# Patient Record
Sex: Male | Born: 1970
Health system: Southern US, Community
[De-identification: ages and names within clinical notes are randomized; demographics above are authoritative.]

## PROBLEM LIST (undated history)

## (undated) DIAGNOSIS — K5792 Diverticulitis of intestine, part unspecified, without perforation or abscess without bleeding: Secondary | ICD-10-CM

## (undated) DIAGNOSIS — K219 Gastro-esophageal reflux disease without esophagitis: Secondary | ICD-10-CM

## (undated) DIAGNOSIS — J302 Other seasonal allergic rhinitis: Secondary | ICD-10-CM

## (undated) DIAGNOSIS — I10 Essential (primary) hypertension: Secondary | ICD-10-CM

## (undated) HISTORY — PX: CHOLECYSTECTOMY: SHX55

## (undated) HISTORY — DX: Essential (primary) hypertension: I10

## (undated) HISTORY — DX: Diverticulitis of intestine, part unspecified, without perforation or abscess without bleeding: K57.92

## (undated) HISTORY — DX: Gastro-esophageal reflux disease without esophagitis: K21.9

## (undated) HISTORY — PX: KNEE SURGERY: SHX244

## (undated) HISTORY — PX: COLONOSCOPY: SHX174

## (undated) HISTORY — PX: UMBILICAL HERNIA REPAIR: SHX196

---

## 2001-09-03 ENCOUNTER — Ambulatory Visit (HOSPITAL_COMMUNITY): Admission: RE | Admit: 2001-09-03 | Discharge: 2001-09-03 | Payer: Self-pay | Admitting: Internal Medicine

## 2001-09-03 ENCOUNTER — Encounter: Payer: Self-pay | Admitting: Internal Medicine

## 2003-12-19 ENCOUNTER — Ambulatory Visit (HOSPITAL_COMMUNITY): Admission: RE | Admit: 2003-12-19 | Discharge: 2003-12-19 | Payer: Self-pay | Admitting: Internal Medicine

## 2004-05-25 ENCOUNTER — Encounter (HOSPITAL_COMMUNITY): Admission: RE | Admit: 2004-05-25 | Discharge: 2004-06-04 | Payer: Self-pay | Admitting: Internal Medicine

## 2004-05-25 ENCOUNTER — Inpatient Hospital Stay (HOSPITAL_COMMUNITY): Admission: RE | Admit: 2004-05-25 | Discharge: 2004-05-27 | Payer: Self-pay | Admitting: Internal Medicine

## 2004-05-25 ENCOUNTER — Ambulatory Visit (HOSPITAL_COMMUNITY): Admission: RE | Admit: 2004-05-25 | Discharge: 2004-05-25 | Payer: Self-pay | Admitting: Internal Medicine

## 2004-06-11 ENCOUNTER — Ambulatory Visit (HOSPITAL_COMMUNITY): Admission: RE | Admit: 2004-06-11 | Discharge: 2004-06-11 | Payer: Self-pay | Admitting: General Surgery

## 2004-09-15 ENCOUNTER — Ambulatory Visit (HOSPITAL_COMMUNITY): Admission: RE | Admit: 2004-09-15 | Discharge: 2004-09-15 | Payer: Self-pay | Admitting: Internal Medicine

## 2004-10-12 ENCOUNTER — Emergency Department (HOSPITAL_COMMUNITY): Admission: EM | Admit: 2004-10-12 | Discharge: 2004-10-12 | Payer: Self-pay | Admitting: Emergency Medicine

## 2009-03-16 ENCOUNTER — Ambulatory Visit (HOSPITAL_COMMUNITY): Admission: RE | Admit: 2009-03-16 | Discharge: 2009-03-16 | Payer: Self-pay | Admitting: Internal Medicine

## 2009-03-27 ENCOUNTER — Encounter (INDEPENDENT_AMBULATORY_CARE_PROVIDER_SITE_OTHER): Payer: Self-pay | Admitting: *Deleted

## 2009-04-05 HISTORY — PX: CHOLECYSTECTOMY: SHX55

## 2009-05-06 HISTORY — PX: ESOPHAGOGASTRODUODENOSCOPY: SHX1529

## 2009-05-08 ENCOUNTER — Ambulatory Visit: Payer: Self-pay | Admitting: Internal Medicine

## 2009-05-08 DIAGNOSIS — Z8639 Personal history of other endocrine, nutritional and metabolic disease: Secondary | ICD-10-CM

## 2009-05-08 DIAGNOSIS — K219 Gastro-esophageal reflux disease without esophagitis: Secondary | ICD-10-CM | POA: Insufficient documentation

## 2009-05-08 DIAGNOSIS — Z862 Personal history of diseases of the blood and blood-forming organs and certain disorders involving the immune mechanism: Secondary | ICD-10-CM | POA: Insufficient documentation

## 2009-05-14 ENCOUNTER — Encounter: Payer: Self-pay | Admitting: Internal Medicine

## 2009-05-15 ENCOUNTER — Encounter: Payer: Self-pay | Admitting: Internal Medicine

## 2009-05-18 ENCOUNTER — Ambulatory Visit (HOSPITAL_COMMUNITY): Admission: RE | Admit: 2009-05-18 | Discharge: 2009-05-18 | Payer: Self-pay | Admitting: Internal Medicine

## 2009-05-18 ENCOUNTER — Ambulatory Visit: Payer: Self-pay | Admitting: Internal Medicine

## 2009-05-18 ENCOUNTER — Encounter: Payer: Self-pay | Admitting: Internal Medicine

## 2009-05-20 ENCOUNTER — Encounter: Payer: Self-pay | Admitting: Internal Medicine

## 2010-04-16 ENCOUNTER — Ambulatory Visit (HOSPITAL_COMMUNITY): Admission: RE | Admit: 2010-04-16 | Discharge: 2010-04-16 | Payer: Self-pay | Admitting: Internal Medicine

## 2010-04-21 ENCOUNTER — Encounter (HOSPITAL_COMMUNITY): Admission: RE | Admit: 2010-04-21 | Discharge: 2010-05-21 | Payer: Self-pay | Admitting: Internal Medicine

## 2010-05-03 ENCOUNTER — Encounter (INDEPENDENT_AMBULATORY_CARE_PROVIDER_SITE_OTHER): Payer: Self-pay | Admitting: General Surgery

## 2010-05-03 ENCOUNTER — Ambulatory Visit (HOSPITAL_COMMUNITY)
Admission: RE | Admit: 2010-05-03 | Discharge: 2010-05-04 | Payer: Self-pay | Source: Home / Self Care | Admitting: General Surgery

## 2010-09-26 ENCOUNTER — Encounter: Payer: Self-pay | Admitting: Internal Medicine

## 2010-09-27 ENCOUNTER — Emergency Department (HOSPITAL_COMMUNITY)
Admission: EM | Admit: 2010-09-27 | Discharge: 2010-09-27 | Payer: Self-pay | Source: Home / Self Care | Admitting: Emergency Medicine

## 2010-09-28 ENCOUNTER — Ambulatory Visit (HOSPITAL_COMMUNITY)
Admission: RE | Admit: 2010-09-28 | Discharge: 2010-09-28 | Payer: Self-pay | Source: Home / Self Care | Attending: Family Medicine | Admitting: Family Medicine

## 2010-09-28 LAB — COMPREHENSIVE METABOLIC PANEL
ALT: 25 U/L (ref 0–53)
Alkaline Phosphatase: 51 U/L (ref 39–117)
BUN: 16 mg/dL (ref 6–23)
GFR calc Af Amer: >60 mL/min (ref >60)
GFR calc non Af Amer: >60 mL/min (ref >60)
Sodium: 135 mEq/L (ref 135–145)
Total Bilirubin: 1.5 mg/dL — ABNORMAL HIGH (ref 0.3–1.2)

## 2010-09-28 LAB — CBC
HCT: 44.1 % (ref 39.0–52.0)
MCH: 30.2 pg (ref 26.0–34.0)
MCHC: 35.8 g/dL (ref 30.0–36.0)
RDW: 12.9 % (ref 11.5–15.5)
WBC: 10 10*3/uL (ref 4.0–10.5)

## 2010-09-28 LAB — URINALYSIS, ROUTINE W REFLEX MICROSCOPIC
Ketones, ur: NEGATIVE mg/dL
Specific Gravity, Urine: >1.03 — ABNORMAL HIGH (ref 1.005–1.030)
Urine Glucose, Fasting: NEGATIVE mg/dL
Urobilinogen, UA: 0.2 mg/dL (ref 0.0–1.0)
pH: 5.5 (ref 5.0–8.0)

## 2010-09-28 LAB — DIFFERENTIAL
Basophils Absolute: 0.1 10*3/uL (ref 0.0–0.1)
Basophils Relative: 1 % (ref 0–1)
Neutrophils Relative %: 63 % (ref 43–77)

## 2010-09-28 LAB — LIPASE, BLOOD: Lipase: 34 U/L (ref 11–59)

## 2010-10-12 ENCOUNTER — Encounter: Payer: Self-pay | Admitting: Gastroenterology

## 2010-10-12 ENCOUNTER — Ambulatory Visit (INDEPENDENT_AMBULATORY_CARE_PROVIDER_SITE_OTHER): Payer: PRIVATE HEALTH INSURANCE | Admitting: Gastroenterology

## 2010-10-12 DIAGNOSIS — R109 Unspecified abdominal pain: Secondary | ICD-10-CM

## 2010-10-12 DIAGNOSIS — R1011 Right upper quadrant pain: Secondary | ICD-10-CM | POA: Insufficient documentation

## 2010-10-13 ENCOUNTER — Encounter: Payer: Self-pay | Admitting: Gastroenterology

## 2010-10-13 ENCOUNTER — Encounter (INDEPENDENT_AMBULATORY_CARE_PROVIDER_SITE_OTHER): Payer: PRIVATE HEALTH INSURANCE | Admitting: Internal Medicine

## 2010-10-13 ENCOUNTER — Encounter: Payer: Self-pay | Admitting: Internal Medicine

## 2010-10-13 DIAGNOSIS — R109 Unspecified abdominal pain: Secondary | ICD-10-CM | POA: Insufficient documentation

## 2010-10-19 ENCOUNTER — Encounter: Payer: Self-pay | Admitting: Gastroenterology

## 2010-10-21 NOTE — Assessment & Plan Note (Signed)
Summary: dropped off stool sample  pt returned ifobt and it was negative  Allergies: 1)  ! Penicillin   Other Orders: Immuno-chemical Fecal Occult ([id number])   Orders Added: 1)  Immuno-chemical Fecal Occult [[id number]]

## 2010-10-27 NOTE — Assessment & Plan Note (Signed)
Summary: ABD PAIN   Vital Signs:  Patient profile:   40 year old male Height:      78 inches Weight:      294 pounds Temp:     97.9 degrees F Pulse rate:   88 / minute BP sitting:   138 / 98  Visit Type:  Initial Consult Referring Provider:  Landry Mellow, PA Primary Care Provider:  Sherwood Gambler   History of Present Illness: Jose [name] is a pleasant 40 year old Caucasian male who presents at the request of Landry Mellow, [place]. He reports onset of sudden, sharp RUQ pain lasting a few minutes approximately 4 weeks ago. Following this a few days later, experienced RLQ pain that again was sudden and lasted a few minutes. He reports BM normal, every 2-3 days, no evidence of melena or hematochezia. He went to ED on 1/24, but no scans were completed. He saw PCP, who then ordered a CT scan as none was done in ED. This showed no acute findings, no evidence of appendicitis, no inflammatory changes of small or large bowel. He had a cholecystectomy in Sept 2010. He is concerned about his LFTs. Noted in Aug 2011 prior to cholecystectomy, Tbili and indirect bilirubin elevated, Dr. Malvin Johns felt related to Gilbert's.  He has had no further episodes of abdominal pain since last Thursday.   Most recent labs Jan 2012: CBC normal, no anemia, lipase 34, Tbili 1.5  otherwise nl.   Allergies: 1)  ! Penicillin  Past History:  Past Medical History: GERD Hypertension TCS, Dr. Karilyn Cota, few polyps, due for repeat 10 years. (unsure year) EGD/ED Sept 2010: Schatzki's ring, s/p dilatation, +eosinophilic esophagitis  Past Surgical History: Umbilical hernia repair Cholecystectomy Aug 2011  Family History: Reviewed history from 05/08/2009 and no changes required. no family history of liver disease, colon cancer, chronic GI illnesses.  Social History: Reviewed history from 05/08/2009 and no changes required. Married, no children. Works with [employer]. Patient has never smoked.  Alcohol Use - no Illicit Drug Use -  no  Review of Systems General:  Denies fever, chills, and anorexia. Eyes:  Denies blurring, irritation, and discharge. ENT:  Denies sore throat, hoarseness, and difficulty swallowing. CV:  Denies chest pains and syncope. Resp:  Denies dyspnea at rest and wheezing. GI:  Complains of abdominal pain; denies difficulty swallowing, pain on swallowing, nausea, indigestion/heartburn, diarrhea, constipation, change in bowel habits, bloody BM's, and black BMs. GU:  Denies urinary burning and urinary frequency. MS:  Denies joint pain / LOM, joint swelling, and joint stiffness. Derm:  Denies rash, itching, and dry skin. Neuro:  Denies weakness and syncope. Psych:  Denies depression and anxiety. Endo:  Denies cold intolerance and heat intolerance. Heme:  Denies bruising and bleeding.  Physical Exam  General:  Well developed, well nourished, no acute distress. Head:  Normocephalic and atraumatic. Eyes:  sclera without icterus Neck:  Supple; no masses or thyromegaly. Lungs:  Clear throughout to auscultation. Heart:  Regular rate and rhythm; no murmurs, rubs,  or bruits. Abdomen:  +BS, obese, soft, non-distended, RUQ mildly TTP, LLQ mildly TTP.  Msk:  Symmetrical with no gross deformities. Normal posture. Pulses:  Normal pulses noted. Extremities:  No clubbing, cyanosis, edema or deformities noted. Neurologic:  Alert and  oriented x4;  grossly normal neurologically. Skin:  Intact without significant lesions or rashes. Psych:  Alert and cooperative. Normal mood and affect.   Impression & Recommendations:  Problem # 1:  ABDOMINAL PAIN-MULTIPLE SITES (ICD-53.22)  40 year old male s/p cholecystectomy  in Aug 2011 with onset of RUQ pain, few minutes lasting approximately 4 weeks ago, followed by RLQ pain a few days later. No melena or hematochezia, no change in bowel habits. No N/V. No further episodes of pain since last Thursday. Slight elevation of Tbili and Indirect bilirubin, consistent with  Gilbert's. elevated bilirubins likely r/t Gilbert's, yet can't exclude presence of possibly hepatobiliary etiology. Prior colonoscopy years ago, unsure date. will obtain ifobt for presence of occult blood.   Hepatic function profile ifobt Consider colonoscopy if +ifobt Further rec's after labs reviewed  Orders: Consultation Level III (04540)  Problem # 2:  LIVER FUNCTION TESTS, ABNORMAL, HX OF (ICD-V12.2)  see # 1.   Orders: Consultation Level III [phone])  Other Orders: T-Hepatic Function [id number])   Orders Added: 1)  T-Hepatic Function [[id number]] 2)  Consultation Level III [30865]  Appended Document: ABD PAIN received colonoscopy from Dr. Gabriel Cirri, April 2007: normal.

## 2010-10-27 NOTE — Letter (Signed)
Summary: [facility] REPORT FROM [facility]  [facility] REPORT FROM [facility]   Imported By: Rexene Alberts 10/19/2010 09:26:29  _____________________________________________________________________  External Attachment:    Type:   Image     Comment:   External Document

## 2010-11-08 LAB — CONVERTED CEMR LAB
ALT: 19 units/L (ref 0–53)
Albumin: 4.7 g/dL (ref 3.5–5.2)
Alkaline Phosphatase: 57 units/L (ref 39–117)
Bilirubin, Direct: 0.2 mg/dL (ref 0.0–0.3)
Total Bilirubin: 1.4 mg/dL — ABNORMAL HIGH (ref 0.3–1.2)
Total Protein: 6.4 g/dL (ref 6.0–8.3)

## 2010-11-18 LAB — SURGICAL PCR SCREEN
MRSA, PCR: NEGATIVE
Staphylococcus aureus: NEGATIVE

## 2010-11-18 LAB — HEPATIC FUNCTION PANEL
AST: 37 U/L (ref 0–37)
Albumin: 3.6 g/dL (ref 3.5–5.2)

## 2010-11-18 LAB — BASIC METABOLIC PANEL
CO2: 28 mEq/L (ref 19–32)
Chloride: 108 mEq/L (ref 96–112)
GFR calc Af Amer: >60 mL/min (ref >60)
Potassium: 3.5 mEq/L (ref 3.5–5.1)

## 2010-11-18 LAB — DIFFERENTIAL
Basophils Absolute: 0 10*3/uL (ref 0.0–0.1)
Lymphocytes Relative: 31 % (ref 12–46)
Lymphs Abs: 2.4 10*3/uL (ref 0.7–4.0)
Monocytes Relative: 6 % (ref 3–12)
Neutro Abs: 4.6 10*3/uL (ref 1.7–7.7)

## 2010-11-18 LAB — CBC
HCT: 37 % — ABNORMAL LOW (ref 39.0–52.0)
MCHC: 34.8 g/dL (ref 30.0–36.0)
RDW: 13.7 % (ref 11.5–15.5)
WBC: 7.9 10*3/uL (ref 4.0–10.5)

## 2011-01-21 NOTE — Consult Note (Signed)
Jose [name], Jose [name]              ACCOUNT NO.:  [id number]   MEDICAL RECORD NO.:  [id number]          PATIENT TYPE:  INP   LOCATION:  A204                          FACILITY:  [facility]   PHYSICIAN:  Dalia Heading, M.D.  DATE OF BIRTH:  11-23-1970   DATE OF CONSULTATION:  05/26/2004  DATE OF DISCHARGE:                                   CONSULTATION   REASON FOR CONSULTATION:  Umbilical cellulitis.   HISTORY OF PRESENT ILLNESS:  The patient is a 40 year old white male who  presented to the hospital with recurrent drainage from his umbilicus.  He  states he has had previous episodes of drainage from his umbilicus, but this  was more severe than before.  The drainage has since somewhat eased.  His  pain around his umbilicus somewhat has eased.   This all started approximately four days ago.   PAST MEDICAL HISTORY:  Unremarkable.   PAST SURGICAL HISTORY:  Unremarkable.   CURRENT MEDICATIONS:  1.  Cleocin.  2.  Levaquin.   ALLERGIES:  No known drug allergies.   REVIEW OF SYSTEMS:  Noncontributory.   PHYSICAL EXAMINATION:  GENERAL:  The patient is a well-developed, well-  nourished white male, in no acute distress.  HEENT:  He is afebrile.  Vital signs are stable.  ABDOMEN:  Soft, nontender, nondistended.  A small umbilical hernia is noted  at the base. No purulent drainage was noted. Minimal erythema is noted  around the umbilicus.   LABORATORY DATA:  White blood cell count is within normal limits.  MET-7 is  within normal limits.   CT scan shows a small umbilical hernia with either fat or a small abscess,  though no intraabdominal pathology is noted.   IMPRESSION:  Umbilical hernia/ cellulitis.  There appears to be no  incarcerated bowel present.   PLAN:  I would continue treating the patient with intravenous antibiotic or  with oral antibiotics.  Once this has cleared, an umbilical herniorrhaphy  would be indicated.  This was explained to the patient who agrees to the  treatment plan.  This was discussed with Dr. Sherwood Gambler.  He is to follow up in  my office on June 01, 2004.      MAJ/MEDQ  D:  05/26/2004  T:  05/26/2004  Job:  [id number]   cc:   Madelin Rear. Sherwood Gambler, M.D.  [address]  [place]  [place] [address]  Fax: [phone]

## 2011-01-21 NOTE — Op Note (Signed)
Jose [name], Jose [name]              ACCOUNT NO.:  [id number]   MEDICAL RECORD NO.:  [id number]          PATIENT TYPE:  AMB   LOCATION:  DAY                           FACILITY:  [facility]   PHYSICIAN:  Dalia Heading, M.D.  DATE OF BIRTH:  1971-02-21   DATE OF PROCEDURE:  06/11/2004  DATE OF DISCHARGE:                                 OPERATIVE REPORT   PREOPERATIVE DIAGNOSIS:  Umbilical hernia.   POSTOPERATIVE DIAGNOSIS:  Umbilical hernia.   OPERATION/PROCEDURE:  Umbilical herniorrhaphy.   SURGEON:  Dalia Heading, M.D.   ANESTHESIA:  General endotracheal anesthesia.   INDICATIONS:  The patient is a 40 year old white male who presents with  umbilical hernia.  The risks and benefits of the procedure including  bleeding, infection, recurrence of the hernia were fully explained to the  patient who gave informed consent.   DESCRIPTION OF PROCEDURE:  The patient was placed in the supine position.  After induction of general endotracheal anesthesia, the abdomen was prepped  and draped using the usual sterile technique with Betadine.  Surgical site  confirmation was performed.   An infraumbilical incision was made down to the fascia.  The umbilicus was  freed away from the underlying umbilical hernia and fascia.  The hernia sac  was excised without difficulty.  The fascial edges were reapproximated using  0 Ethibond interrupted sutures.  The base of the umbilicus was secured back  to the fascia using a 2-0 Vicryl interrupted suture.  The subcutaneous layer  was reapproximated using a 3-0 Vicryl interrupted suture.  The skin was  closed using __________.  Sensorcaine 0.5% was instilled into the  surrounding wound.   All tape and needle counts were correct at the end of the procedure.  The  patient was extubated in the operating room and went back to the recovery  room awake in stable condition.   COMPLICATIONS:  None.   SPECIMENS:  None.   ESTIMATED BLOOD LOSS:  MinimalLoraine Leriche   MAJ/MEDQ  D:  06/11/2004  T:  06/11/2004  Job:  [id number]   cc:   Madelin Rear. Sherwood Gambler, M.D.  [address]  [place]  [place] [address]  Fax: [phone]

## 2011-01-21 NOTE — Discharge Summary (Signed)
Jose [name], Jose [name]              ACCOUNT NO.:  [id number]   MEDICAL RECORD NO.:  [id number]          PATIENT TYPE:  INP   LOCATION:  A204                          FACILITY:  [facility]   PHYSICIAN:  Madelin Rear. Sherwood Gambler, MD  DATE OF BIRTH:  [birth year]   DATE OF ADMISSION:  05/25/2004  DATE OF DISCHARGE:  09/22/2005LH                                 DISCHARGE SUMMARY   DISCHARGE DIAGNOSES:  1.  Cellulitis of abdomen.  2.  Umbilical hernia.   DISCHARGE MEDICATIONS:  Cleocin 300 mg p.o. q.i.d.   SUMMARY:  The patient was admitted with abdominal pain and drainage of foul  smelling, purulent material from the umbilical area.  He was on outpatient  antibiotic therapy, including IV daily doses of Rocephin, but progressed and  became constitutionally affected with myalgias and malaise, fever and  chills.  CT scan was obtained and revealed no large collections, but there  was apparently evidence of a urachal cyst infection.  He was seen in  consultation by surgery for a noted umbilical hernia and omphalitis.  He  resolved well on IV antibiotics and was scheduled for elective umbilical  hernia repair per Dr. Lovell Sheehan.     Lawr   LJF/MEDQ  D:  06/25/2004  T:  06/25/2004  Job:  [id number]

## 2011-01-21 NOTE — H&P (Signed)
NAMEHENOK, Jose [name]              ACCOUNT NO.:  [id number]   MEDICAL RECORD NO.:  [id number]          PATIENT TYPE:  INP   LOCATION:  A204                          FACILITY:  [facility]   PHYSICIAN:  Madelin Rear. Fusco, M.D.DATE OF BIRTH:  Aug 09, 1971   DATE OF ADMISSION:  05/25/2004  DATE OF DISCHARGE:  LH                                HISTORY & PHYSICAL   CHIEF COMPLAINT:  Abdominal pain.   HISTORY OF PRESENT ILLNESS:  The patient has been seen two or three days  prior to admission for mid periumbilical abdominal pain followed by an  abrupt rupture of foul-smelling purulent material.  He was noted at that  time to have a periumbilical cellulitis on physical examination.  He was  started on outpatient parenteral antibiotics with Rocephin 1 g IV daily  given, and after several days he has clinically deteriorated having  increasing malaise, nausea, and polymyalgia.  Due to his failure on  parenteral antibiotics, he was admitted for the addition of antibiotics and  close broader spectrum coverage.  The patient has had no complaints of  hematemesis, hematochezia, or melena.  No cough or sputum, and no arthritic  complaints.   PAST MEDICAL HISTORY:  Noncontributory.   SOCIAL HISTORY:  Noncontributory.   FAMILY HISTORY:  Noncontributory.   REVIEW OF SYSTEMS:  As under HPI, all else is negative.   PHYSICAL EXAMINATION:  GENERAL:  He is awake, alert, cooperative, in no  acute distress.  HEENT:  No JVD or adenopathy.  NECK:  Supple.  CARDIAC:  Regular rhythm with no murmurs, rubs, or gallops.  ABDOMEN:  Periumbilical erythema is noted with increased temperature noted  as well.  There was no frank subcutaneous emphysema noted on physical  examination.  The remainder of his abdominal examination was negative.  EXTREMITIES:  Without cyanosis, clubbing, or edema.  NEUROLOGIC:  Nonfocal.   LABORATORY DATA:  CT scan was obtained to rule out a ureteral cyst/abscess  or intra-abdominal process,  and this was positive for a possible umbilical  hernia containing fat only, as well as a small pocket of subcutaneous  emphysema noted.  No large collections of fluid or frank abscess formation  was noted.  CBC was unremarkable.  Electrolytes unrevealing.  Urinalysis  negative.   IMPRESSION:  Periumbilical cellulitis with soft tissue gas, probably  secondary to abscess rupture.  He is started on broad spectrum coverage,  specifically anaerobes are of concern, so Cleocin was added to Levaquin.  He  will be seen in consultation by surgery to see if anything needs to be done  in terms of exploration/debridement/repair of umbilical hernia in the  future.      LJF/MEDQ  D:  05/26/2004  T:  05/26/2004  Job:  [id number]

## 2011-01-21 NOTE — H&P (Signed)
Jose [name], Jose [name]              ACCOUNT NO.:  [id number]   MEDICAL RECORD NO.:  [id number]           PATIENT TYPE:   LOCATION:                                 FACILITY:   PHYSICIAN:  Dalia Heading, M.D.       DATE OF BIRTH:   DATE OF ADMISSION:  06/11/2004  DATE OF DISCHARGE:  LH                                HISTORY & PHYSICAL   CHIEF COMPLAINT:  Umbilical hernia.   HISTORY OF PRESENT ILLNESS:  The patient is a 40 year old white male who  presented with cellulitis of his umbilicus to [facility].  This was  treated with antibiotics, and he now presents for an umbilical  herniorrhaphy.   PAST MEDICAL HISTORY:  Unremarkable.   PAST SURGICAL HISTORY:  Unremarkable.   CURRENT MEDICATIONS:  None.   ALLERGIES:  No known drug allergies.   REVIEW OF SYSTEMS:  Noncontributory.   PHYSICAL EXAMINATION:  GENERAL:  The patient is an obese white male in no  acute distress.  VITAL SIGNS:  He is afebrile, and vital signs are stable.  CHEST:  Lungs clear to auscultation with equal breath sounds bilaterally.  CARDIAC:  Regular rate and rhythm without S3, S4, or murmurs.  ABDOMEN:  Soft with a small umbilical hernia present at the base.  No  purulent drainage is noted.   IMPRESSION:  Umbilical hernia.   PLAN:  The patient is scheduled for an umbilical herniorrhaphy on June 11, 2004.  The risks and benefits of the procedure, including bleeding,  infection, and recurrence of the hernia, were fully explained to the  patient, who gave informed consent.       ___________________________________________  Dalia Heading, M.D.    MAJ/MEDQ  D:  06/08/2004  T:  06/08/2004  Job:  [id number]   cc:   [facility]   Madelin Rear. Sherwood Gambler, M.D.  [address]  [place]  [place] [address]  Fax: [phone]

## 2011-12-10 IMAGING — CT CT ABD-PELV W/ CM
2 of 4 series · 17 of 46 positions shown, 19 images · IV contrast (agent unspecified)
Comparison: 10/12/2004

CLINICAL DATA: The right lower quadrant pain

CT ABDOMEN AND PELVIS WITH CONTRAST
TECHNIQUE: Multidetector CT imaging of the abdomen and pelvis was
performed following the standard protocol without intravenous
contrast.
Contrast:  100 ml Dmnipaque-MNN

[Series 2: abd_pel_with 5.0 b40f · axial · 0.94mm/px · z∈[-534,-8]mm · 14 of 115 slices shown, 16 images]
[im 5/115  soft-tissue]
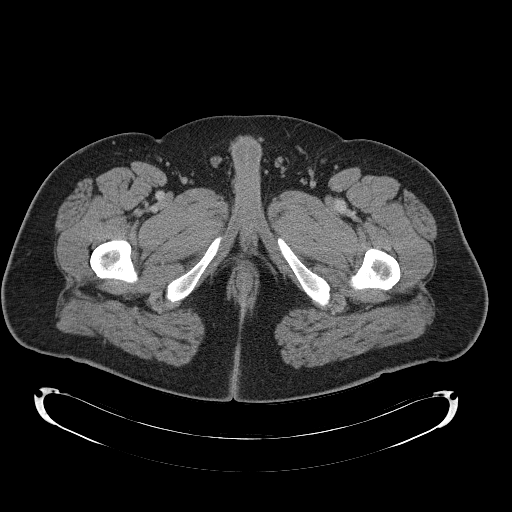
[im 5/115  bone]
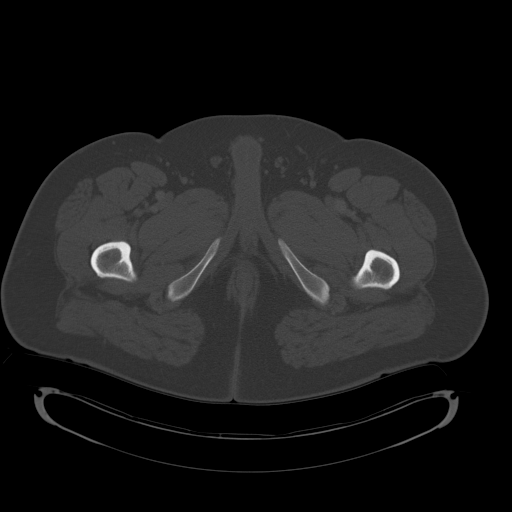
[im 15/115  soft-tissue]
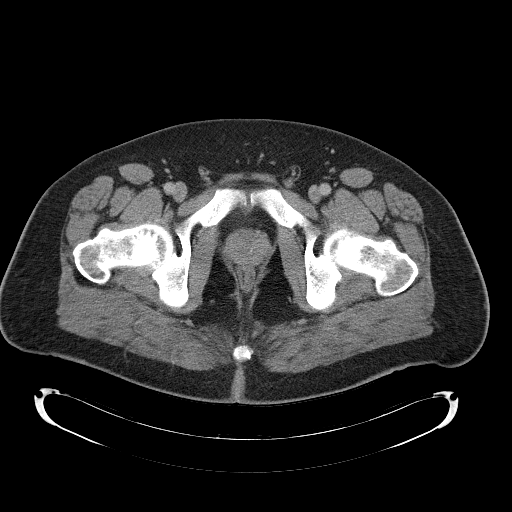
[im 20/115  soft-tissue]
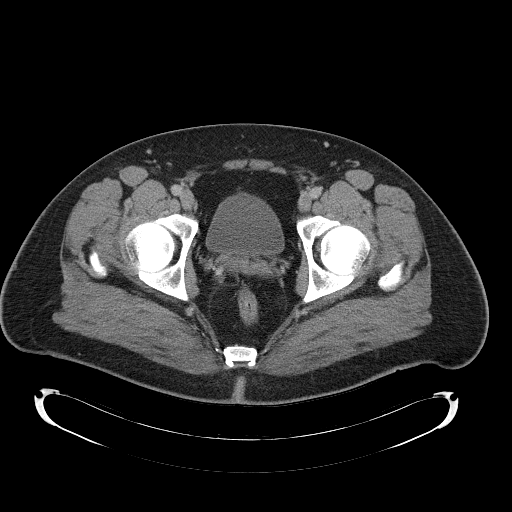
[im 30/115  soft-tissue]
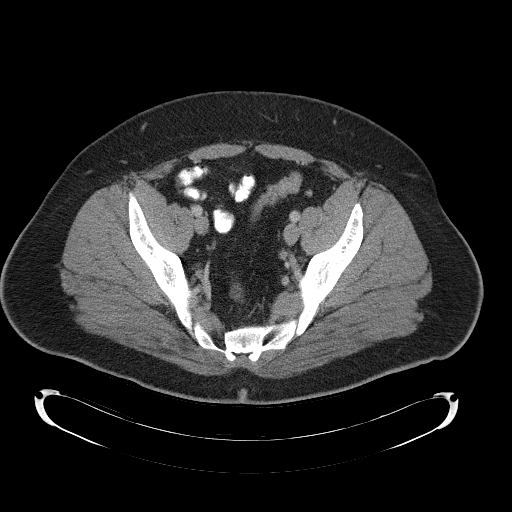
[im 40/115  soft-tissue]
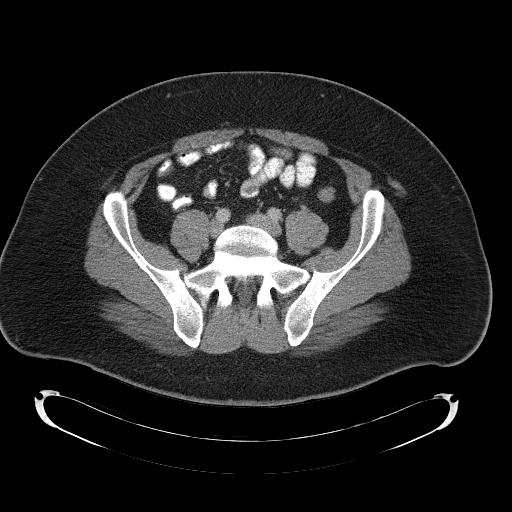
[im 45/115  soft-tissue]
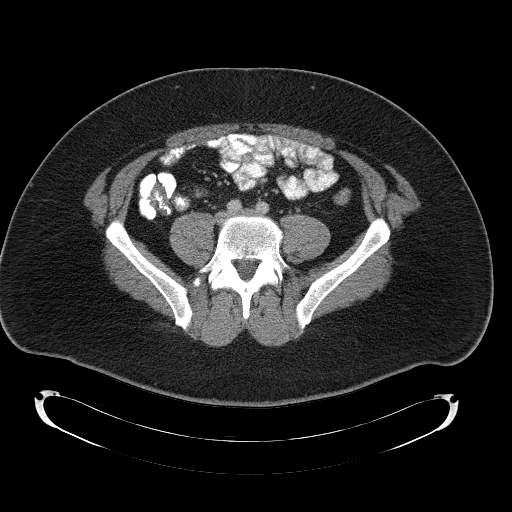
[im 55/115  soft-tissue]
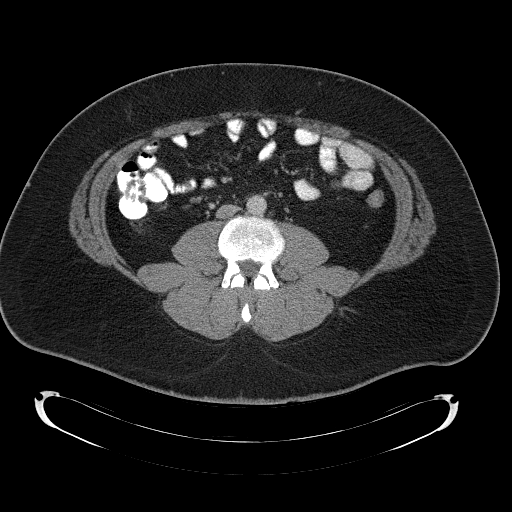
[im 60/115  soft-tissue]
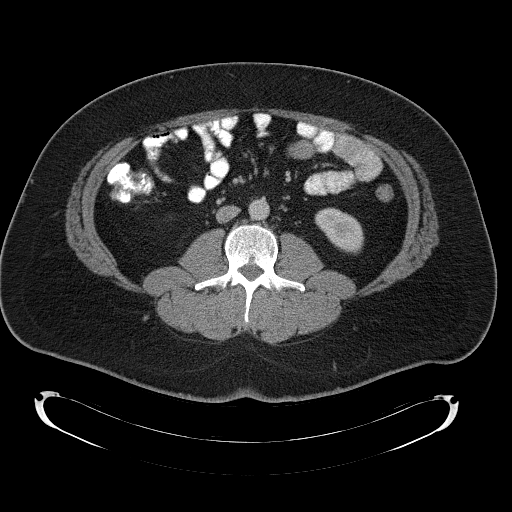
[im 70/115  soft-tissue]
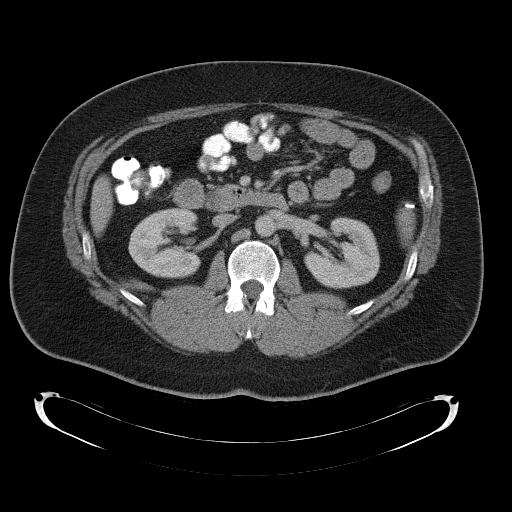
[im 70/115  bone]
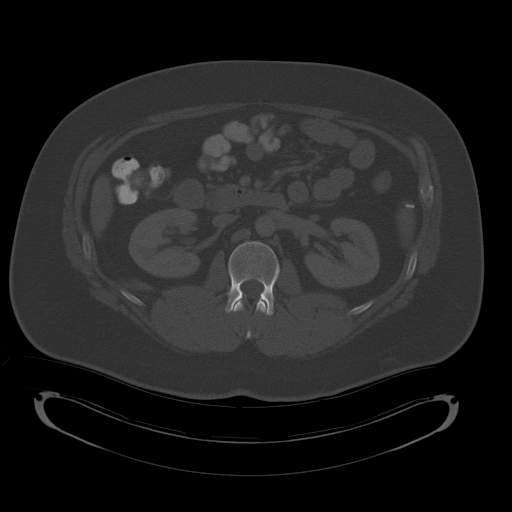
[im 75/115  soft-tissue]
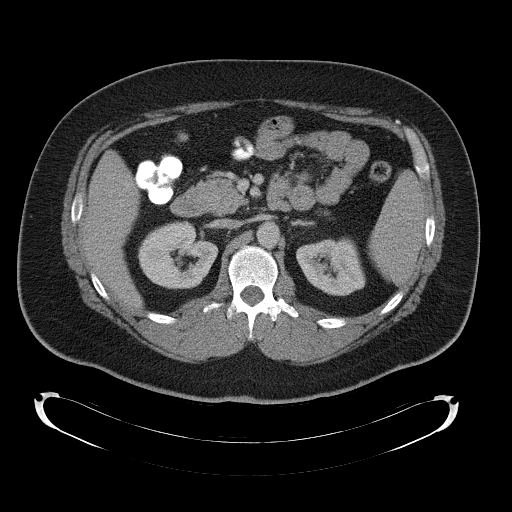
[im 85/115  soft-tissue]
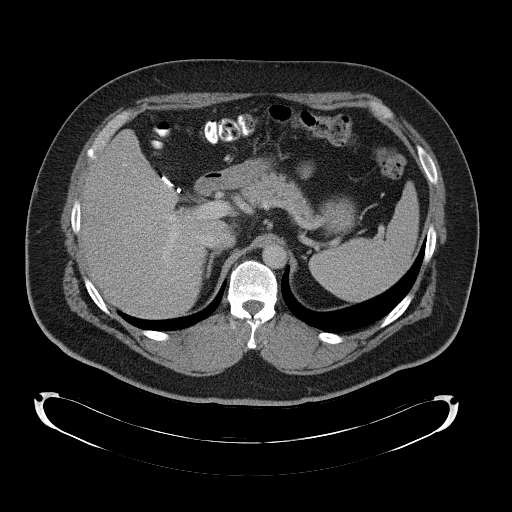
[im 95/115  soft-tissue]
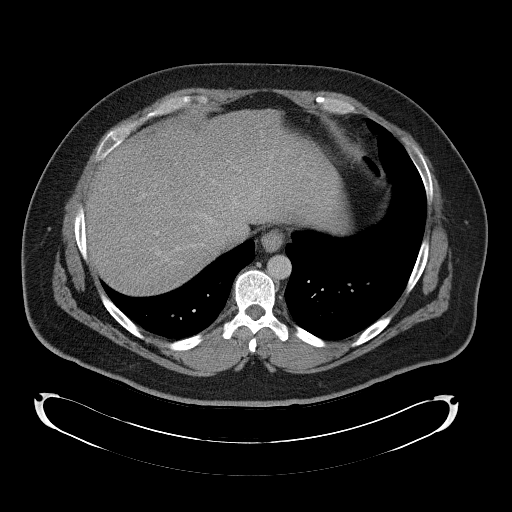
[im 100/115  soft-tissue]
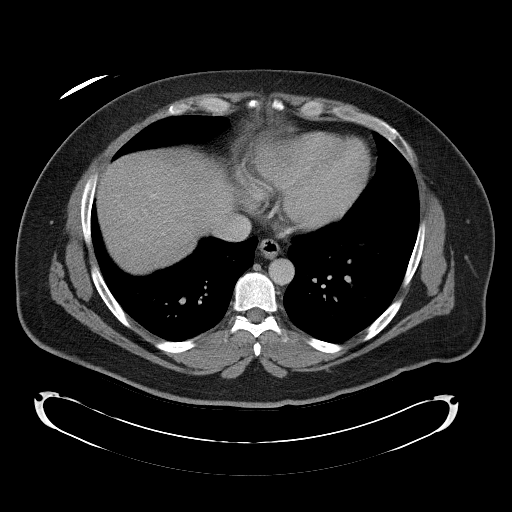
[im 110/115  soft-tissue]
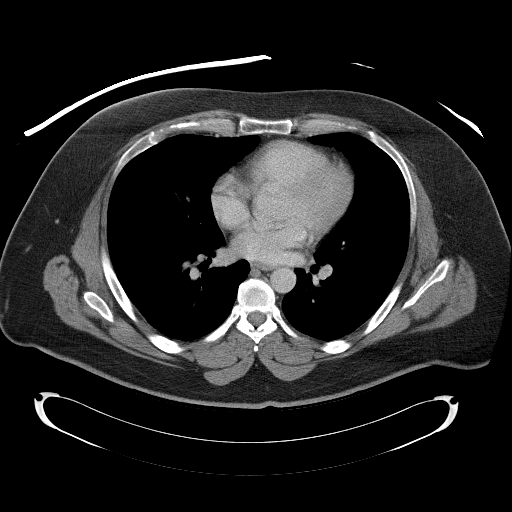

[Series 4: abd_pel_with 3.0 spo cor · coronal · 0.90mm/px · 3 of 99 slices shown]
[im 33/99  soft-tissue]
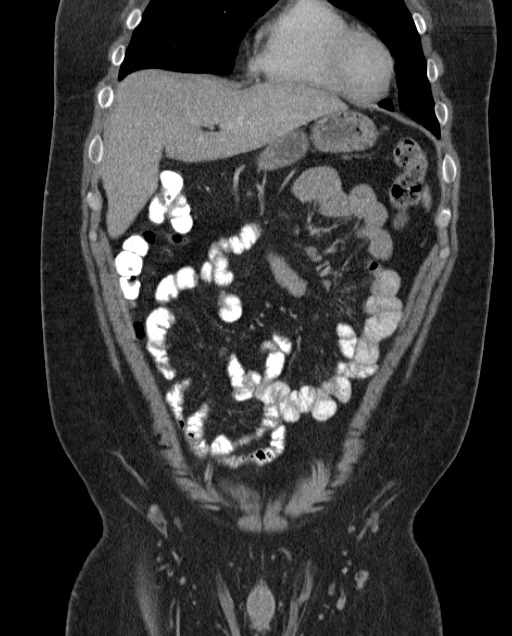
[im 44/99  soft-tissue]
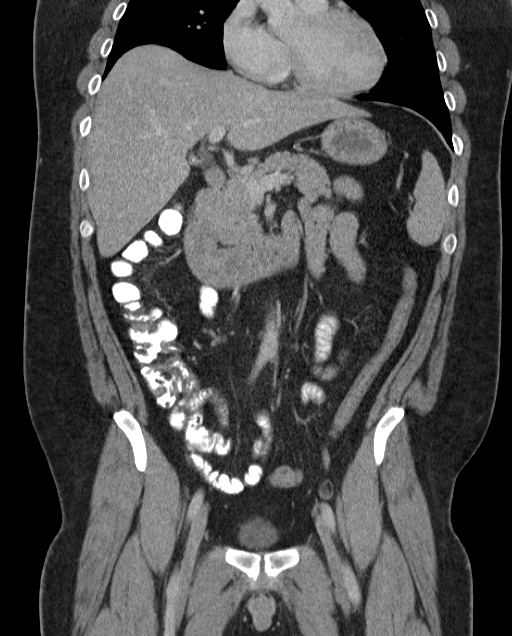
[im 55/99  soft-tissue]
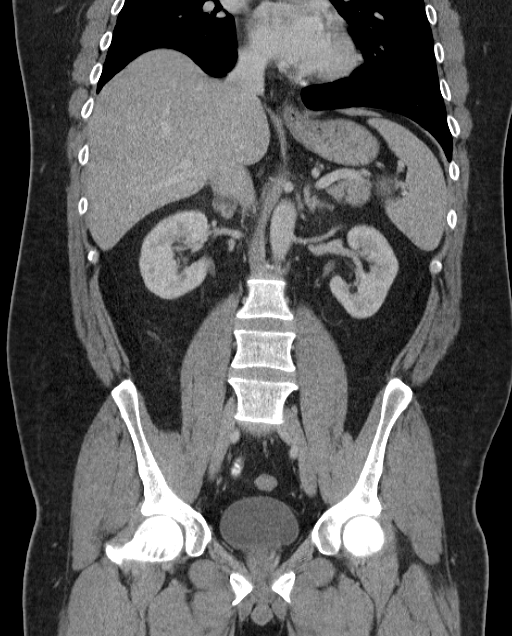

[17 of 46 positions shown; findings below may reference images not displayed]

FINDINGS: The lung bases are clear. Liver, spleen, pancreas, and
adrenals normal.  No urinary tract calculi or obstruction.  No
adenopathy or ascites.  No mass or fluid collections.  Prior
cholecystectomy.

There are no inflammatory changes of the large or small bowel.  The
appendix is small with no evidence for inflammation.  Osseous
structures intact.
IMPRESSION: 1.  No acute or significant findings.
2.  Specifically  no CT evidence for appendicitis or other findings
in the right lower quadrant that would explain the patient's
symptoms.
3.  Prior cholecystectomy.

## 2012-02-09 ENCOUNTER — Encounter: Payer: Self-pay | Admitting: Orthopedic Surgery

## 2012-02-09 ENCOUNTER — Ambulatory Visit (INDEPENDENT_AMBULATORY_CARE_PROVIDER_SITE_OTHER): Payer: PRIVATE HEALTH INSURANCE | Admitting: Orthopedic Surgery

## 2012-02-09 ENCOUNTER — Ambulatory Visit (INDEPENDENT_AMBULATORY_CARE_PROVIDER_SITE_OTHER): Payer: PRIVATE HEALTH INSURANCE

## 2012-02-09 VITALS — BP 110/80 | Ht 78.0 in | Wt 306.0 lb

## 2012-02-09 DIAGNOSIS — M25569 Pain in unspecified knee: Secondary | ICD-10-CM | POA: Insufficient documentation

## 2012-02-09 DIAGNOSIS — M25462 Effusion, left knee: Secondary | ICD-10-CM

## 2012-02-09 DIAGNOSIS — M2350 Chronic instability of knee, unspecified knee: Secondary | ICD-10-CM

## 2012-02-09 DIAGNOSIS — M179 Osteoarthritis of knee, unspecified: Secondary | ICD-10-CM

## 2012-02-09 DIAGNOSIS — M1710 Unilateral primary osteoarthritis, unspecified knee: Secondary | ICD-10-CM | POA: Insufficient documentation

## 2012-02-09 DIAGNOSIS — M23329 Other meniscus derangements, posterior horn of medial meniscus, unspecified knee: Secondary | ICD-10-CM

## 2012-02-09 DIAGNOSIS — M25469 Effusion, unspecified knee: Secondary | ICD-10-CM

## 2012-02-09 MED ORDER — DICLOFENAC POTASSIUM 50 MG PO TABS: 50.0000 mg | ORAL_TABLET | Freq: Two times a day (BID) | ORAL | Status: DC

## 2012-02-09 NOTE — Patient Instructions (Addendum)
You have received a steroid shot. 15% of patients experience increased pain at the injection site with in the next 24 hours. This is best treated with ice and tylenol extra strength 2 tabs every 8 hours. If you are still having pain please call the office.   Apply ice at the end of each day   Wear sleeve   start new medication

## 2012-02-09 NOTE — Progress Notes (Signed)
Patient ID: Jose [name], male   DOB: April 24, 1971, 41 y.o.   MRN: [id number] Chief Complaint  Patient presents with  . Knee Pain    left knee pain, old injury and surgery 1997, fell 02/06/12 and hurting again    Left knee pain since Monday of this week. Patient has had knee arthroscopic surgery 1997 was told at that time he'll need second procedures later on he is basically had on and off symptoms for several years now. However on Monday he twisted his knee again and since that time he said throbbing 5/10 intermittent pain with swelling and a feeling of weakness in the knee. It already been wearing a knee sleeve. He does not complaining of catching or locking.  He has a review of systems positive for fatigue heartburn joint pain and seasonal allergies although the systems were negative  Past Medical History  Diagnosis Date  . GERD (gastroesophageal reflux disease)   . Hypertension     Past Surgical History  Procedure Date  . Colonoscopy     by Dr. Lane Hacker polyps  . Esophagogastroduodenoscopy 05/2009    Schatzki's ring, s/p dilatation + eosinophilic  . Cholecystectomy 04/2009  . Umbilical hernia repair   . Knee surgery     left  . Cholecystectomy     family history includes Cancer in an unspecified family member and Heart disease in an unspecified family member.  BP 110/80  Ht 6\' 6"  (1.981 m)  Wt 306 lb (138.801 kg)  BMI 35.36 kg/m2   General appearance is normal  The patient is alert and oriented x3  The patient's mood and affect are normal  Gait assessment: He has a limp favoring his left leg The cardiovascular exam reveals normal pulses and temperature without edema swelling.  The lymphatic system is negative for palpable lymph nodes  The sensory exam is normal.  There are no pathologic reflexes.  Balance is normal.   Exam of the left knee swelling moderate joint effusion, he also has medial joint line tenderness and lateral joint line tenderness laxity of the  cruciate ligament, normal strength skin is intact McMurray sign is equivocal  Right knee full range of motion normal strength and stability normal skin no tenderness no swelling  Upper extremity exam  Inspection and palpation revealed no abnormalities in the upper extremities.  Range of motion is full without contracture.  Motor exam is normal with grade 5 strength.  The joints are fully reduced without subluxation.  There is no atrophy or tremor and muscle tone is normal.  All joints are stable.  X-rays show a significant amount of arthrosis for his age  Impression arthritis, meniscal pathology  Recommend aspiration injection anti-inflammatories and a reevaluation in 2 weeks  Knee  Injection and aspiration Procedure Note  Pre-operative Diagnosis: left knee oa, effusion  Post-operative Diagnosis: same  Indications: pain, swelling  Anesthesia: ethyl chloride   Procedure Details   Verbal consent was obtained for the procedure. Time out was completed.The joint was prepped with alcohol, followed by  Ethyl chloride spray and The 18-gauge needle was inserted into the joint via lateral approach and we aspirated approximately 10 cc of clear yellow fluid  This was followed by the injection of 4ml 1% lidocaine and 1 ml of depomedrol  was then injected into the joint . The needle was removed and the area cleansed and dressed.  Complications:  None; patient tolerated the procedure well.

## 2012-02-22 ENCOUNTER — Ambulatory Visit (INDEPENDENT_AMBULATORY_CARE_PROVIDER_SITE_OTHER): Payer: PRIVATE HEALTH INSURANCE | Admitting: Orthopedic Surgery

## 2012-02-22 VITALS — BP 152/90 | Ht 78.0 in | Wt 306.0 lb

## 2012-02-22 DIAGNOSIS — M23329 Other meniscus derangements, posterior horn of medial meniscus, unspecified knee: Secondary | ICD-10-CM

## 2012-02-22 NOTE — Patient Instructions (Addendum)
Arthroscopic Procedure, Knee An arthroscopic procedure can find what is wrong with your knee. PROCEDURE Arthroscopy is a surgical technique that allows your orthopedic surgeon to diagnose and treat your knee injury with accuracy. They will look into your knee through a small instrument. This is almost like a small (pencil sized) telescope. Because arthroscopy affects your knee less than open knee surgery, you can anticipate a more rapid recovery. Taking an active role by following your caregiver's instructions will help with rapid and complete recovery. Use crutches, rest, elevation, ice, and knee exercises as instructed. The length of recovery depends on various factors including type of injury, age, physical condition, medical conditions, and your rehabilitation. Your knee is the joint between the large bones (femur and tibia) in your leg. Cartilage covers these bone ends which are smooth and slippery and allow your knee to bend and move smoothly. Two menisci, thick, semi-lunar shaped pads of cartilage which form a rim inside the joint, help absorb shock and stabilize your knee. Ligaments bind the bones together and support your knee joint. Muscles move the joint, help support your knee, and take stress off the joint itself. Because of this all programs and physical therapy to rehabilitate an injured or repaired knee require rebuilding and strengthening your muscles. AFTER THE PROCEDURE  After the procedure, you will be moved to a recovery area until most of the effects of the medication have worn off. Your caregiver will discuss the test results with you.   Only take over-the-counter or prescription medicines for pain, discomfort, or fever as directed by your caregiver.  SEEK MEDICAL CARE IF:    You have increased bleeding from your wounds.   You see redness, swelling, or have increasing pain in your wounds.   You have pus coming from your wound.   You have an oral temperature above 102 F  (38.9 C).   You notice a bad smell coming from the wound or dressing.   You have severe pain with any motion of your knee.  SEEK IMMEDIATE MEDICAL CARE IF:    You develop a rash.   You have difficulty breathing.   You have any allergic problems.  Document Released: 08/19/2000 Document Revised: 08/11/2011 Document Reviewed: 03/12/2008 [facility] Patient Information 2012 [place], [place].Arthroscopic Procedure, Knee Care After Refer to this sheet in the next few weeks. These discharge instructions provide you with general information on caring for yourself after you leave the hospital. Your caregiver may also give you specific instructions. Your treatment has been planned according to the most current medical practices available, but unavoidable complications sometimes occur. If you have any problems or questions after discharge, please call your caregiver. HOME CARE INSTRUCTIONS   It will be normal to be sore for a couple days after surgery. See your caregiver if this seems to be getting worse rather than better. Only take over-the-counter or prescription medicines for pain, discomfort, or fever as directed by your caregiver.  Take showers rather than baths, or as directed by your caregiver.   Change bandages (dressings) if necessary or as directed.   You may resume normal diet and activities as directed or allowed.   Avoid lifting or driving until you are directed otherwise.   Make an appointment to see your caregiver for stitches (suture) or staple removal as directed.   You may put ice on the area.   Put ice in a plastic bag.   Place a towel between your skin and the bag.   Leave the ice  on for 15 to 20 minutes, 3 to 4 times per day for the first 2 days.  SEEK MEDICAL CARE IF:    You have increased bleeding from your wounds.   You see redness, swelling, or have increasing pain in your wounds.   You have pus coming from your wound.   You have an oral temperature above 102  F (38.9 C).   You notice a bad smell coming from the wound or dressing.   You have severe pain with any motion of your knee.  SEEK IMMEDIATE MEDICAL CARE IF:    You develop a rash.   You have difficulty breathing   You develop any reaction or side effects to medicines taken.  MAKE SURE YOU:    Understand these instructions.   Will watch your condition.   Will get help right away if you are not doing well or get worse.  Document Released: 03/11/2005 Document Revised: 08/11/2011 Document Reviewed: 11/15/2007 [facility] Patient Information 2012 [place], [place].

## 2012-02-23 ENCOUNTER — Encounter: Payer: Self-pay | Admitting: Orthopedic Surgery

## 2012-02-23 ENCOUNTER — Other Ambulatory Visit: Payer: Self-pay | Admitting: *Deleted

## 2012-02-23 NOTE — Progress Notes (Signed)
Patient ID: Jose [name], male   DOB: [birth year], 41 y.o.   MRN: [id number] Chief Complaint  Patient presents with  . Follow-up    2 week recheck left knee    BP 152/90  Ht 6\' 6"  (1.981 m)  Wt 306 lb (138.801 kg)  BMI 35.36 kg/m2  The patient has had a LEFT knee injection. Had a previous arthroscopy over 10 years ago. His injection gave him some moderate relief, but he still symptomatic. Still having trouble getting in and out of his truck.  We discussed his treatment options and we have decided to proceed with arthroscopy of the LEFT knee for partial medial meniscectomy and joint debridement.  He understands the, risk, benefits of the procedure.  History and physical be dictated at a later date and is incorporated by ref

## 2012-02-24 ENCOUNTER — Telehealth: Payer: Self-pay | Admitting: Orthopedic Surgery

## 2012-02-24 NOTE — Telephone Encounter (Signed)
Regarding out-patient surgery scheduled 03/16/12 at [facility], [place] [address], [phone] - Contacted insurer, [employer], and advised to contact [employer] insurance at ph# [phone].  Per Elijio Miles, patient is transitioning to the [employer], effective 03/05/12.  She states patient will then be covered under a 70/30 plan with $1000 deductible.  Insurance cards have been mailed as of 02/21/12.  Shanda Bumps verified that the above procedure codes do not required pre-authorization for out-patient basis.  * Our office will follow up 03/05/12 to re-verify coverage and authorization.

## 2012-03-01 ENCOUNTER — Encounter (HOSPITAL_COMMUNITY): Payer: Self-pay | Admitting: Pharmacy Technician

## 2012-03-05 NOTE — Telephone Encounter (Addendum)
Called to follow-up as per above, regarding new coverage to be effective as of today.  On hold for over 18 minutes.  Call back as soon as possible to try again.  (03/06/12 to 03/07/12) Called back to ph# [phone]; referred to 24-hr a day ph# [phone], automated system. Attempted to leave message.  Also called patient - no card as of yet in mail; received fax (per patient's wife) with [employer] contact information #'s (same as on file.)  Patient will bring in card as soon as received. In meantime, continue to try to reach insurer (following holiday.)

## 2012-03-09 NOTE — Patient Instructions (Addendum)
20 Jose [name]  03/09/2012   Your procedure is scheduled on:   03/16/2012  Report to [facility] at   615  AM.  Call this number if you have problems the morning of surgery: [phone]   Remember:   Do not eat food:After Midnight.  May have clear liquids:until Midnight .   Take these medicines the morning of surgery with A SIP OF WATER:  Vasotec,hydrochlorothiaziade, allegra   Do not wear jewelry, make-up or nail polish.  Do not wear lotions, powders, or perfumes. You may wear deodorant.  Do not shave 48 hours prior to surgery. Men may shave face and neck.  Do not bring valuables to the hospital.  Contacts, dentures or bridgework may not be worn into surgery.  Leave suitcase in the car. After surgery it may be brought to your room.  For patients admitted to the hospital, checkout time is 11:00 AM the day of discharge.   Patients discharged the day of surgery will not be allowed to drive home.  Name and phone number of your driver: family  Special Instructions: CHG Shower Use Special Wash: 1/2 bottle night before surgery and 1/2 bottle morning of surgery.   Please read over the following fact sheets that you were given: Pain Booklet, MRSA Information, Surgical Site Infection Prevention, Anesthesia Post-op Instructions and Care and Recovery After Surgery Arthroscopic Procedure, Knee An arthroscopic procedure can find what is wrong with your knee. PROCEDURE Arthroscopy is a surgical technique that allows your orthopedic surgeon to diagnose and treat your knee injury with accuracy. They will look into your knee through a small instrument. This is almost like a small (pencil sized) telescope. Because arthroscopy affects your knee less than open knee surgery, you can anticipate a more rapid recovery. Taking an active role by following your caregiver's instructions will help with rapid and complete recovery. Use crutches, rest, elevation, ice, and knee exercises as instructed. The length  of recovery depends on various factors including type of injury, age, physical condition, medical conditions, and your rehabilitation. Your knee is the joint between the large bones (femur and tibia) in your leg. Cartilage covers these bone ends which are smooth and slippery and allow your knee to bend and move smoothly. Two menisci, thick, semi-lunar shaped pads of cartilage which form a rim inside the joint, help absorb shock and stabilize your knee. Ligaments bind the bones together and support your knee joint. Muscles move the joint, help support your knee, and take stress off the joint itself. Because of this all programs and physical therapy to rehabilitate an injured or repaired knee require rebuilding and strengthening your muscles. AFTER THE PROCEDURE  After the procedure, you will be moved to a recovery area until most of the effects of the medication have worn off. Your caregiver will discuss the test results with you.   Only take over-the-counter or prescription medicines for pain, discomfort, or fever as directed by your caregiver.  SEEK MEDICAL CARE IF:   You have increased bleeding from your wounds.   You see redness, swelling, or have increasing pain in your wounds.   You have pus coming from your wound.   You have an oral temperature above 102 F (38.9 C).   You notice a bad smell coming from the wound or dressing.   You have severe pain with any motion of your knee.  SEEK IMMEDIATE MEDICAL CARE IF:   You develop a rash.   You have difficulty breathing.  You have any allergic problems.  Document Released: 08/19/2000 Document Revised: 08/11/2011 Document Reviewed: 03/12/2008 [facility] Patient Information 2012 [place], [place].PATIENT INSTRUCTIONS POST-ANESTHESIA  IMMEDIATELY FOLLOWING SURGERY:  Do not drive or operate machinery for the first twenty four hours after surgery.  Do not make any important decisions for twenty four hours after surgery or while taking narcotic  pain medications or sedatives.  If you develop intractable nausea and vomiting or a severe headache please notify your doctor immediately.  FOLLOW-UP:  Please make an appointment with your surgeon as instructed. You do not need to follow up with anesthesia unless specifically instructed to do so.  WOUND CARE INSTRUCTIONS (if applicable):  Keep a dry clean dressing on the anesthesia/puncture wound site if there is drainage.  Once the wound has quit draining you may leave it open to air.  Generally you should leave the bandage intact for twenty four hours unless there is drainage.  If the epidural site drains for more than 36-48 hours please call the anesthesia department.  QUESTIONS?:  Please feel free to call your physician or the hospital operator if you have any questions, and they will be happy to assist you.

## 2012-03-12 ENCOUNTER — Encounter (HOSPITAL_COMMUNITY)
Admission: RE | Admit: 2012-03-12 | Discharge: 2012-03-12 | Disposition: A | Discharge status: Home / Self Care | Payer: PRIVATE HEALTH INSURANCE | Source: Ambulatory Visit | Attending: Orthopedic Surgery | Admitting: Orthopedic Surgery

## 2012-03-12 ENCOUNTER — Encounter (HOSPITAL_COMMUNITY): Payer: Self-pay

## 2012-03-12 HISTORY — DX: Other seasonal allergic rhinitis: J30.2

## 2012-03-12 LAB — BASIC METABOLIC PANEL
CO2: 29 mEq/L (ref 19–32)
Glucose, Bld: 108 mg/dL — ABNORMAL HIGH (ref 70–99)
Potassium: 3.6 mEq/L (ref 3.5–5.1)
Sodium: 144 mEq/L (ref 135–145)

## 2012-03-12 LAB — HEMOGLOBIN AND HEMATOCRIT, BLOOD: HCT: 45.6 % (ref 39.0–52.0)

## 2012-03-12 NOTE — Progress Notes (Signed)
03/12/12 1106  OBSTRUCTIVE SLEEP APNEA  Have you ever been diagnosed with sleep apnea through a sleep study? No  Do you snore loudly (loud enough to be heard through closed doors)?  0  Do you often feel tired, fatigued, or sleepy during the daytime? 0  Has anyone observed you stop breathing during your sleep? 0  Do you have, or are you being treated for high blood pressure? 1  BMI more than 35 kg/m2? 1  Age over 40 years old? 0  Neck circumference greater than 40 cm/18 inches? 1  Gender: 1  Obstructive Sleep Apnea Score 4

## 2012-03-13 NOTE — Telephone Encounter (Signed)
Called back to insurer [employer] at same ph# [phone]; reached representative GregShima O, who has verified that no pre-authorization is required for CPT [phone], or for out-patient patient surgery in general, as long as not in-patient or "experimental.."  States to use her name and today's date, 03/13/12, for reference.

## 2012-03-16 ENCOUNTER — Encounter (HOSPITAL_COMMUNITY): Payer: Self-pay | Admitting: Anesthesiology

## 2012-03-16 ENCOUNTER — Ambulatory Visit (HOSPITAL_COMMUNITY)
Admission: RE | Admit: 2012-03-16 | Discharge: 2012-03-16 | Disposition: A | Discharge status: Home / Self Care | Payer: PRIVATE HEALTH INSURANCE | Source: Ambulatory Visit | Attending: Orthopedic Surgery | Admitting: Orthopedic Surgery

## 2012-03-16 ENCOUNTER — Ambulatory Visit (HOSPITAL_COMMUNITY): Payer: PRIVATE HEALTH INSURANCE | Admitting: Anesthesiology

## 2012-03-16 ENCOUNTER — Encounter (HOSPITAL_COMMUNITY): Payer: Self-pay | Admitting: *Deleted

## 2012-03-16 ENCOUNTER — Encounter (HOSPITAL_COMMUNITY)
Admission: RE | Disposition: A | Discharge status: Home / Self Care | Payer: Self-pay | Source: Ambulatory Visit | Attending: Orthopedic Surgery

## 2012-03-16 DIAGNOSIS — IMO0002 Reserved for concepts with insufficient information to code with codable children: Secondary | ICD-10-CM | POA: Insufficient documentation

## 2012-03-16 DIAGNOSIS — M23302 Other meniscus derangements, unspecified lateral meniscus, unspecified knee: Secondary | ICD-10-CM | POA: Insufficient documentation

## 2012-03-16 DIAGNOSIS — Z79899 Other long term (current) drug therapy: Secondary | ICD-10-CM | POA: Insufficient documentation

## 2012-03-16 DIAGNOSIS — Z01812 Encounter for preprocedural laboratory examination: Secondary | ICD-10-CM | POA: Insufficient documentation

## 2012-03-16 DIAGNOSIS — Z01810 Encounter for preprocedural cardiovascular examination: Secondary | ICD-10-CM | POA: Insufficient documentation

## 2012-03-16 DIAGNOSIS — S83289A Other tear of lateral meniscus, current injury, unspecified knee, initial encounter: Secondary | ICD-10-CM

## 2012-03-16 DIAGNOSIS — I10 Essential (primary) hypertension: Secondary | ICD-10-CM | POA: Insufficient documentation

## 2012-03-16 DIAGNOSIS — M1710 Unilateral primary osteoarthritis, unspecified knee: Secondary | ICD-10-CM | POA: Insufficient documentation

## 2012-03-16 HISTORY — PX: KNEE ARTHROSCOPY: SHX127

## 2012-03-16 SURGERY — ARTHROSCOPY, KNEE, WITH LATERAL MENISCECTOMY
Anesthesia: General | Site: Knee | Laterality: Left | Wound class: Clean

## 2012-03-16 MED ORDER — FENTANYL CITRATE 0.05 MG/ML IJ SOLN
INTRAMUSCULAR | Status: AC
  Filled 2012-03-16: qty 2

## 2012-03-16 MED ORDER — ONDANSETRON HCL 4 MG/2ML IJ SOLN: 4.0000 mg | Freq: Once | INTRAMUSCULAR | Status: DC

## 2012-03-16 MED ORDER — FENTANYL CITRATE 0.05 MG/ML IJ SOLN
INTRAMUSCULAR | Status: DC | PRN
  Administered 2012-03-16 (×6): 50 ug via INTRAVENOUS

## 2012-03-16 MED ORDER — GLYCOPYRROLATE 0.2 MG/ML IJ SOLN
INTRAMUSCULAR | Status: AC
  Administered 2012-03-16: 0.2 mg via INTRAVENOUS
  Filled 2012-03-16: qty 1

## 2012-03-16 MED ORDER — OXYCODONE HCL 5 MG PO TABS
5.0000 mg | ORAL_TABLET | Freq: Once | ORAL | Status: AC
  Administered 2012-03-16: 5 mg via ORAL

## 2012-03-16 MED ORDER — LIDOCAINE HCL (PF) 1 % IJ SOLN
INTRAMUSCULAR | Status: AC
  Filled 2012-03-16: qty 5

## 2012-03-16 MED ORDER — PROMETHAZINE HCL 12.5 MG PO TABS: 12.5000 mg | ORAL_TABLET | Freq: Four times a day (QID) | ORAL | Status: DC | PRN

## 2012-03-16 MED ORDER — PROPOFOL 10 MG/ML IV EMUL
INTRAVENOUS | Status: AC
  Filled 2012-03-16: qty 40

## 2012-03-16 MED ORDER — ONDANSETRON HCL 4 MG/2ML IJ SOLN
INTRAMUSCULAR | Status: AC
  Administered 2012-03-16: 4 mg via INTRAVENOUS
  Filled 2012-03-16: qty 2

## 2012-03-16 MED ORDER — SUCCINYLCHOLINE CHLORIDE 20 MG/ML IJ SOLN
INTRAMUSCULAR | Status: AC
  Filled 2012-03-16: qty 1

## 2012-03-16 MED ORDER — CHLORHEXIDINE GLUCONATE 4 % EX LIQD
60.0000 mL | Freq: Once | CUTANEOUS | Status: DC
  Filled 2012-03-16: qty 60

## 2012-03-16 MED ORDER — MIDAZOLAM HCL 2 MG/2ML IJ SOLN
INTRAMUSCULAR | Status: AC
  Administered 2012-03-16: 2 mg via INTRAVENOUS
  Filled 2012-03-16: qty 2

## 2012-03-16 MED ORDER — VANCOMYCIN HCL 1000 MG IV SOLR
1500.0000 mg | INTRAVENOUS | Status: DC
  Filled 2012-03-16: qty 1500

## 2012-03-16 MED ORDER — ACETAMINOPHEN 325 MG PO TABS: 325.0000 mg | ORAL_TABLET | ORAL | Status: DC | PRN

## 2012-03-16 MED ORDER — BUPIVACAINE-EPINEPHRINE PF 0.5-1:200000 % IJ SOLN
INTRAMUSCULAR | Status: AC
  Filled 2012-03-16: qty 20

## 2012-03-16 MED ORDER — SODIUM CHLORIDE 0.9 % IR SOLN
Status: DC | PRN
  Administered 2012-03-16 (×6)

## 2012-03-16 MED ORDER — KETOROLAC TROMETHAMINE 30 MG/ML IJ SOLN
30.0000 mg | Freq: Once | INTRAMUSCULAR | Status: AC
  Administered 2012-03-16: 30 mg via INTRAVENOUS

## 2012-03-16 MED ORDER — MIDAZOLAM HCL 2 MG/2ML IJ SOLN
1.0000 mg | INTRAMUSCULAR | Status: DC | PRN
  Administered 2012-03-16 (×2): 2 mg via INTRAVENOUS

## 2012-03-16 MED ORDER — SODIUM CHLORIDE 0.9 % IR SOLN
Status: DC | PRN
  Administered 2012-03-16: 1000 mL

## 2012-03-16 MED ORDER — FENTANYL CITRATE 0.05 MG/ML IJ SOLN: 25.0000 ug | INTRAMUSCULAR | Status: DC | PRN

## 2012-03-16 MED ORDER — ONDANSETRON HCL 4 MG/2ML IJ SOLN
4.0000 mg | Freq: Once | INTRAMUSCULAR | Status: AC
  Administered 2012-03-16: 4 mg via INTRAVENOUS

## 2012-03-16 MED ORDER — CELECOXIB 100 MG PO CAPS
ORAL_CAPSULE | ORAL | Status: AC
  Administered 2012-03-16: 400 mg via ORAL
  Filled 2012-03-16: qty 4

## 2012-03-16 MED ORDER — EPINEPHRINE HCL 1 MG/ML IJ SOLN
INTRAMUSCULAR | Status: AC
  Filled 2012-03-16: qty 8

## 2012-03-16 MED ORDER — GLYCOPYRROLATE 0.2 MG/ML IJ SOLN
0.2000 mg | Freq: Once | INTRAMUSCULAR | Status: AC
  Administered 2012-03-16: 0.2 mg via INTRAVENOUS

## 2012-03-16 MED ORDER — LIDOCAINE HCL (CARDIAC) 10 MG/ML IV SOLN
INTRAVENOUS | Status: DC | PRN
  Administered 2012-03-16: 50 mg via INTRAVENOUS

## 2012-03-16 MED ORDER — LACTATED RINGERS IV SOLN
INTRAVENOUS | Status: DC | PRN
  Administered 2012-03-16: 07:00:00 via INTRAVENOUS

## 2012-03-16 MED ORDER — ONDANSETRON HCL 4 MG/2ML IJ SOLN
4.0000 mg | Freq: Once | INTRAMUSCULAR | Status: AC | PRN
  Administered 2012-03-16: 4 mg via INTRAVENOUS

## 2012-03-16 MED ORDER — CELECOXIB 100 MG PO CAPS
400.0000 mg | ORAL_CAPSULE | Freq: Once | ORAL | Status: AC
  Administered 2012-03-16: 400 mg via ORAL

## 2012-03-16 MED ORDER — PROPOFOL 10 MG/ML IV EMUL
INTRAVENOUS | Status: DC | PRN
  Administered 2012-03-16: 300 mg via INTRAVENOUS

## 2012-03-16 MED ORDER — BUPIVACAINE-EPINEPHRINE PF 0.5-1:200000 % IJ SOLN
INTRAMUSCULAR | Status: DC | PRN
  Administered 2012-03-16: 60 mL

## 2012-03-16 MED ORDER — OXYCODONE HCL 5 MG PO TABS
ORAL_TABLET | ORAL | Status: AC
  Administered 2012-03-16: 5 mg via ORAL
  Filled 2012-03-16: qty 1

## 2012-03-16 MED ORDER — KETOROLAC TROMETHAMINE 30 MG/ML IJ SOLN
INTRAMUSCULAR | Status: AC
  Administered 2012-03-16: 30 mg via INTRAVENOUS
  Filled 2012-03-16: qty 1

## 2012-03-16 MED ORDER — VANCOMYCIN HCL 1000 MG IV SOLR
1500.0000 mg | INTRAVENOUS | Status: DC | PRN
  Administered 2012-03-16: 1500 mg via INTRAVENOUS

## 2012-03-16 MED ORDER — LACTATED RINGERS IV SOLN
INTRAVENOUS | Status: DC
  Administered 2012-03-16: 07:00:00 via INTRAVENOUS

## 2012-03-16 MED ORDER — HYDROCODONE-ACETAMINOPHEN 10-325 MG PO TABS: 1.0000 | ORAL_TABLET | ORAL | Status: AC | PRN

## 2012-03-16 SURGICAL SUPPLY — 60 items
0.9% SODIUM CHLORIDE IRRIGATION IMPLANT
ARTHROWAND PARAGON T2 (SURGICAL WAND)
BAG HAMPER (MISCELLANEOUS) ×3 IMPLANT
BANDAGE ELASTIC 6 VELCRO NS (GAUZE/BANDAGES/DRESSINGS) ×3 IMPLANT
BLADE AGGRESSIVE PLUS 4.0 (BLADE) ×3 IMPLANT
BLADE SURG SZ11 CARB STEEL (BLADE) ×3 IMPLANT
BUR ROUND 5.0 (BURR) ×2 IMPLANT
CHLORAPREP W/TINT 26ML (MISCELLANEOUS) ×2 IMPLANT
CLOTH BEACON ORANGE TIMEOUT ST (SAFETY) ×3 IMPLANT
COOLER CRYO IC GRAV AND TUBE (ORTHOPEDIC SUPPLIES) ×3 IMPLANT
CUFF CRYO KNEE LG 20X31 COOLER (ORTHOPEDIC SUPPLIES) ×2 IMPLANT
CUFF CRYO KNEE18X23 MED (MISCELLANEOUS) IMPLANT
CUFF TOURNIQUET SINGLE 34IN LL (TOURNIQUET CUFF) ×2 IMPLANT
CUFF TOURNIQUET SINGLE 44IN (TOURNIQUET CUFF) IMPLANT
CUTTER ANGLED DBL BITE 4.5 (BURR) IMPLANT
DECANTER SPIKE VIAL GLASS SM (MISCELLANEOUS) ×6 IMPLANT
EPINEPHRINE 1MG/ML (1:1000) IMPLANT
FLOOR PAD 36X40 (MISCELLANEOUS)
GAUZE SPONGE 4X4 16PLY XRAY LF (GAUZE/BANDAGES/DRESSINGS) ×3 IMPLANT
GAUZE XEROFORM 5X9 LF (GAUZE/BANDAGES/DRESSINGS) ×3 IMPLANT
GLOVE BIOGEL PI IND STRL 7.0 (GLOVE) ×1 IMPLANT
GLOVE BIOGEL PI INDICATOR 7.0 (GLOVE) ×1
GLOVE EXAM NITRILE MD LF STRL (GLOVE) ×4 IMPLANT
GLOVE OPTIFIT SS 6.5 STRL BRWN (GLOVE) ×2 IMPLANT
GLOVE SKINSENSE NS SZ8.0 LF (GLOVE) ×1
GLOVE SKINSENSE STRL SZ8.0 LF (GLOVE) ×2 IMPLANT
GLOVE SS N UNI LF 8.5 STRL (GLOVE) ×3 IMPLANT
GOWN STRL REIN XL XLG (GOWN DISPOSABLE) ×7 IMPLANT
HLDR LEG FOAM (MISCELLANEOUS) ×2 IMPLANT
IV NS IRRIG 3000ML ARTHROMATIC (IV SOLUTION) ×14 IMPLANT
KIT BLADEGUARD II DBL (SET/KITS/TRAYS/PACK) ×3 IMPLANT
KIT ROOM TURNOVER AP CYSTO (KITS) ×3 IMPLANT
LEG HOLDER FOAM (MISCELLANEOUS) ×1
MANIFOLD NEPTUNE II (INSTRUMENTS) ×3 IMPLANT
MARKER SKIN DUAL TIP RULER LAB (MISCELLANEOUS) ×3 IMPLANT
NDL HYPO 18GX1.5 BLUNT FILL (NEEDLE) ×1 IMPLANT
NDL HYPO 21X1.5 SAFETY (NEEDLE) ×1 IMPLANT
NDL SPNL 18GX3.5 QUINCKE PK (NEEDLE) ×1 IMPLANT
NEEDLE HYPO 18GX1.5 BLUNT FILL (NEEDLE) ×3 IMPLANT
NEEDLE HYPO 21X1.5 SAFETY (NEEDLE) ×3 IMPLANT
NEEDLE SPNL 18GX3.5 QUINCKE PK (NEEDLE) ×3 IMPLANT
NS IRRIG 1000ML POUR BTL (IV SOLUTION) ×3 IMPLANT
PACK ARTHRO LIMB DRAPE STRL (MISCELLANEOUS) ×3 IMPLANT
PAD ABD 5X9 TENDERSORB (GAUZE/BANDAGES/DRESSINGS) ×3 IMPLANT
PAD ARMBOARD 7.5X6 YLW CONV (MISCELLANEOUS) ×3 IMPLANT
PAD FLOOR 36X40 (MISCELLANEOUS) ×1 IMPLANT
PADDING CAST COTTON 6X4 STRL (CAST SUPPLIES) ×3 IMPLANT
PIN STMN 9X.110IN (PIN) ×2 IMPLANT
SET ARTHROSCOPY INST (INSTRUMENTS) ×3 IMPLANT
SET ARTHROSCOPY PUMP TUBE (IRRIGATION / IRRIGATOR) ×3 IMPLANT
SET BASIN LINEN APH (SET/KITS/TRAYS/PACK) ×3 IMPLANT
SPONGE GAUZE 4X4 12PLY (GAUZE/BANDAGES/DRESSINGS) ×3 IMPLANT
STRIP CLOSURE SKIN 1/2X4 (GAUZE/BANDAGES/DRESSINGS) ×1 IMPLANT
SUT ETHILON 3 0 FSL (SUTURE) ×2 IMPLANT
SYR 30ML LL (SYRINGE) ×3 IMPLANT
SYRINGE 10CC LL (SYRINGE) ×3 IMPLANT
WAND 50 DEG COVAC W/CORD (SURGICAL WAND) IMPLANT
WAND 90 DEG TURBOVAC W/CORD (SURGICAL WAND) ×2 IMPLANT
WAND ARTHRO PARAGON T2 (SURGICAL WAND) IMPLANT
YANKAUER SUCT BULB TIP 10FT TU (MISCELLANEOUS) ×11 IMPLANT

## 2012-03-16 NOTE — Brief Op Note (Signed)
03/16/2012  9:13 AM  PATIENT:  Jose [name]  42 y.o. male  PRE-OPERATIVE DIAGNOSIS:  osteoarthritis of the left knee  POST-OPERATIVE DIAGNOSIS:  osteoarthritis of the left knee, torn lateral meniscus left knee, chronic anterior cruciate ligament tear left knee  PROCEDURE:  Procedure(s) (LRB):  KNEE ARTHROSCOPY WITH LATERAL MENISECTOMY (Left) ARTHROSCOPY KNEE (drilling and abrasion arthroplasty trochlea)  FINDINGS:  GRADE 4 TROCHLEA LESION  OLD MEDIAL MENISECTOMY - NORMAL  NEW LATERAL MENISCUS TEAR  SURGEON:  Surgeon(s) and Role:    * Vickki Hearing, MD - Primary  PHYSICIAN ASSISTANT:   ASSISTANTS: none   ANESTHESIA:   general  EBL:  Total I/O In: 500 [I.V.:500] Out: 0   BLOOD ADMINISTERED:none  DRAINS: none   LOCAL MEDICATIONS USED:  MARCAINE   , Amount: 60 ml and OTHER epi  SPECIMEN:  No Specimen  DISPOSITION OF SPECIMEN:  N/A  COUNTS:  YES  TOURNIQUET:  * Missing tourniquet times found for documented tourniquets in log:  [id number] *  DICTATION: .Dragon Dictation  PLAN OF CARE: Discharge to home after PACU  PATIENT DISPOSITION:  PACU - hemodynamically stable.   Delay start of Pharmacological VTE agent (>24hrs) due to surgical blood loss or risk of bleeding: not applicable

## 2012-03-16 NOTE — Preoperative (Signed)
Beta Blockers   Reason not to administer Beta Blockers:Not Applicable 

## 2012-03-16 NOTE — Op Note (Signed)
03/16/2012  9:13 AM  PATIENT:  Jose [name]  41 y.o. male  PRE-OPERATIVE DIAGNOSIS:  osteoarthritis of the left knee  POST-OPERATIVE DIAGNOSIS:  osteoarthritis of the left knee, torn lateral meniscus left knee, chronic anterior cruciate ligament tear left knee  PROCEDURE:  Procedure(s) (LRB):  KNEE ARTHROSCOPY WITH LATERAL MENISECTOMY (Left) ARTHROSCOPY KNEE (drilling and abrasion arthroplasty trochlea)  FINDINGS:  GRADE 4 TROCHLEA LESION  OLD MEDIAL MENISECTOMY - NORMAL  NEW LATERAL MENISCUS TEAR  Details of procedure patient was identified in the preop holding area the left knee was marked as a surgical site countersigned in the chart was updated. The patient was taken to the operating room and given preoperative antibiotic. In the supine position he was intubated. His right leg was placed in a padded well leg holder the left knee was placed in an arthroscopic leg holder.  The left leg was then prepped and draped sterilely.  Standard lateral portal was established the scope was placed into the joint a spinal needle was used to create a medial portal and a probe was placed in the joint. A diagnostic arthroscopy was performed in the following manner. Starting in the medial compartment the articular surfaces were normal the old medial meniscal tear had been resected and was stable. There is no arthritic change in the medial compartment  In the notch. The PCL is normal the anterior cruciate ligament lateral wall was empty the anterior cruciate ligament attachment site was devoid of anterior cruciate ligament tissue there appeared to be remnant of anterior cruciate ligament tissue scar to the posterior cruciate ligament. The lateral compartments of some mild chondral changes grade 1 and a torn lateral meniscus mainly the posterior horn. The entire joint appeared to be one that had an anterior cruciate ligament tear with characteristic lateral meniscal injury and then a subsequent medial  meniscal tear secondary to joint instability.  The patella tracked laterally. There is a large 6 x 9 lesion in the trochlea grade 4.  Lateral meniscal tear was resected and balance to a stable rim a motorized shaver confirmed stability with a probe  The trochlear lesion was curetted to a stable rim and an abrasion arthroplasty was performed followed by drilling chondroplasty  The joint was irrigated with 3 cycles and wash mode joint debris was suctioned and synovial bleeding was cauterized  3 sutures were placed in the medial portal one in the lateral portal with 3-0 nylon suture we injected 60 cc of Marcaine with epinephrine.  Sterile dressing and Cryo/Cuff were applied Cryo/Cuff was activated  Patient extubated taken to recovery room in stable condition  Recommend crutches weightbearing as tolerated  SURGEON:  Surgeon(s) and Role:    * Vickki Hearing, MD - Primary  PHYSICIAN ASSISTANT:   ASSISTANTS: none   ANESTHESIA:   general  EBL:  Total I/O In: 500 [I.V.:500] Out: 0   BLOOD ADMINISTERED:none  DRAINS: none   LOCAL MEDICATIONS USED:  MARCAINE   , Amount: 60 ml and OTHER epi  SPECIMEN:  No Specimen  DISPOSITION OF SPECIMEN:  N/A  COUNTS:  YES  TOURNIQUET:  * Missing tourniquet times found for documented tourniquets in log:  [id number] *  DICTATION: .Dragon Dictation  PLAN OF CARE: Discharge to home after PACU  PATIENT DISPOSITION:  PACU - hemodynamically stable.   Delay start of Pharmacological VTE agent (>24hrs) due to surgical blood loss or risk of bleeding: not applicable

## 2012-03-16 NOTE — Anesthesia Preprocedure Evaluation (Signed)
Anesthesia Evaluation  Patient identified by MRN, date of birth, ID band Patient awake    Reviewed: Allergy & Precautions, H&P , NPO status , Patient's Chart, lab work & pertinent test results  Airway Mallampati: II TM Distance: >3 FB Neck ROM: Full    Dental No notable dental hx.    Pulmonary neg pulmonary ROS,    Pulmonary exam normal       Cardiovascular hypertension, Pt. on medications Rhythm:Regular Rate:Normal     Neuro/Psych negative neurological ROS  negative psych ROS   GI/Hepatic Neg liver ROS, GERD-  Medicated,  Endo/Other  negative endocrine ROS  Renal/GU negative Renal ROS     Musculoskeletal  (+) Arthritis -, Osteoarthritis,    Abdominal (+) + obese,  Abdomen: soft.    Peds  Hematology negative hematology ROS (+)   Anesthesia Other Findings   Reproductive/Obstetrics                           Anesthesia Physical Anesthesia Plan  ASA: II  Anesthesia Plan: General   Post-op Pain Management:    Induction: Intravenous, Rapid sequence and Cricoid pressure planned  Airway Management Planned: Oral ETT  Additional Equipment:   Intra-op Plan:   Post-operative Plan: Extubation in OR  Informed Consent: I have reviewed the patients History and Physical, chart, labs and discussed the procedure including the risks, benefits and alternatives for the proposed anesthesia with the patient or authorized representative who has indicated his/her understanding and acceptance.   Dental advisory given  Plan Discussed with: CRNA  Anesthesia Plan Comments:         Anesthesia Quick Evaluation

## 2012-03-16 NOTE — Anesthesia Procedure Notes (Signed)
Procedure Name: Intubation Date/Time: 03/16/2012 7:52 AM Performed by: Carolyne Littles, AMY L Pre-anesthesia Checklist: Patient identified, Patient being monitored, Timeout performed, Emergency Drugs available and Suction available Patient Re-evaluated:Patient Re-evaluated prior to inductionOxygen Delivery Method: Circle System Utilized Preoxygenation: Pre-oxygenation with 100% oxygen Intubation Type: IV induction, Rapid sequence and Cricoid Pressure applied Ventilation: Mask ventilation without difficulty Laryngoscope Size: 3 and Miller Grade View: Grade I Tube type: Oral Tube size: 8.0 mm Number of attempts: 1 Airway Equipment and Method: stylet Placement Confirmation: ETT inserted through vocal cords under direct vision,  positive ETCO2 and breath sounds checked- equal and bilateral Secured at: 21 cm Tube secured with: Tape Dental Injury: Teeth and Oropharynx as per pre-operative assessment

## 2012-03-16 NOTE — Transfer of Care (Signed)
Immediate Anesthesia Transfer of Care Note  Patient: Jose [name]  Procedure(s) Performed: Procedure(s) (LRB): KNEE ARTHROSCOPY WITH MEDIAL MENISECTOMY (Left) KNEE ARTHROSCOPY WITH LATERAL MENISECTOMY (Left) ARTHROSCOPY KNEE ()  Patient Location: PACU  Anesthesia Type: General  Level of Consciousness: oriented, sedated and patient cooperative  Airway & Oxygen Therapy: Patient Spontanous Breathing and Patient connected to face mask oxygen  Post-op Assessment: Report given to PACU RN and Post -op Vital signs reviewed and stable  Post vital signs: Reviewed and stable  Complications: No apparent anesthesia complications

## 2012-03-16 NOTE — Progress Notes (Signed)
Pt states he has crutches at home and knows how to crutch-walk.

## 2012-03-16 NOTE — Anesthesia Postprocedure Evaluation (Signed)
  Anesthesia Post-op Note  Patient: Jose [name]  Procedure(s) Performed: Procedure(s) (LRB): KNEE ARTHROSCOPY WITH LATERAL MENISECTOMY (Left) ARTHROSCOPY KNEE (Left)  Patient Location: PACU  Anesthesia Type: General  Level of Consciousness: awake, alert , oriented and patient cooperative  Airway and Oxygen Therapy: Patient Spontanous Breathing and Patient connected to face mask oxygen  Post-op Pain: mild  Post-op Assessment: Post-op Vital signs reviewed, Patient's Cardiovascular Status Stable, Respiratory Function Stable, Patent Airway, No signs of Nausea or vomiting and Pain level controlled  Post-op Vital Signs: Reviewed and stable  Complications: No apparent anesthesia complications

## 2012-03-16 NOTE — H&P (Addendum)
  Chief Complaint   Patient presents with   .  Knee Pain     left knee pain, old injury and surgery 1997, fell 02/06/12 and hurting again   Left knee pain since Monday of this week. Patient has had knee arthroscopic surgery 1997 was told at that time he'll need second procedures later on he is basically had on and off symptoms for several years now. However on Monday he twisted his knee again and since that time he said throbbing 5/10 intermittent pain with swelling and a feeling of weakness in the knee. It already been wearing a knee sleeve. He does not complaining of catching or locking.  He has a review of systems positive for fatigue heartburn joint pain and seasonal allergies although the systems were negative. INJECTION DID NOT HELP  ROS: NEGATIVE   Past Medical History   Diagnosis  Date   .  GERD (gastroesophageal reflux disease)    .  Hypertension     Past Surgical History   Procedure  Date   .  Colonoscopy      by Dr. Lane Hacker polyps   .  Esophagogastroduodenoscopy  05/2009     Schatzki's ring, s/p dilatation + eosinophilic   .  Cholecystectomy  04/2009   .  Umbilical hernia repair    .  Knee surgery      left   .  Cholecystectomy     family history includes Cancer in an unspecified family member and Heart disease in an unspecified family member.   History  Substance Use Topics  . Smoking status: Never Smoker   . Smokeless tobacco: Not on file  . Alcohol Use: No   BP 110/80  Ht 6\' 6"  (1.981 m)  Wt 306 lb (138.801 kg)  BMI 35.36 kg/m2  General appearance is normal  The patient is alert and oriented x3  The patient's mood and affect are normal  Gait assessment: He has a limp favoring his left leg The cardiovascular exam reveals normal pulses and temperature without edema swelling.  The lymphatic system is negative for palpable lymph nodes  The sensory exam is normal.  There are no pathologic reflexes.  Balance is normal.  Exam of the left knee swelling moderate  joint effusion, he also has medial joint line tenderness and lateral joint line tenderness laxity of the cruciate ligament, normal strength skin is intact McMurray sign is equivocal  Right knee full range of motion normal strength and stability normal skin no tenderness no swelling  Upper extremity exam  Inspection and palpation revealed no abnormalities in the upper extremities. Range of motion is full without contracture.  Motor exam is normal with grade 5 strength.  The joints are fully reduced without subluxation.  There is no atrophy or tremor and muscle tone is normal. All joints are stable.   X-rays show a significant amount of arthrosis for his age   Impression arthritis, meniscal pathology  Plan arthroscopy left knee, PARTIAL MEDIAL MENISECTOMY AND JOINT DEBRIDEMENT

## 2012-03-19 ENCOUNTER — Encounter: Payer: Self-pay | Admitting: Orthopedic Surgery

## 2012-03-19 ENCOUNTER — Ambulatory Visit (INDEPENDENT_AMBULATORY_CARE_PROVIDER_SITE_OTHER): Payer: PRIVATE HEALTH INSURANCE | Admitting: Orthopedic Surgery

## 2012-03-19 ENCOUNTER — Ambulatory Visit: Payer: PRIVATE HEALTH INSURANCE | Admitting: Orthopedic Surgery

## 2012-03-19 VITALS — BP 130/80 | Ht 77.0 in | Wt 304.0 lb

## 2012-03-19 DIAGNOSIS — Z9889 Other specified postprocedural states: Secondary | ICD-10-CM

## 2012-03-19 MED FILL — Sodium Chloride Irrigation Soln 0.9%: Qty: 1000 | Status: AC

## 2012-03-19 MED FILL — Epinephrine HCl Inj 1 MG/ML: INTRAMUSCULAR | Qty: 1 | Status: AC

## 2012-03-19 NOTE — Progress Notes (Signed)
Patient ID: HAWKEN BIELBY, male   DOB: 1971/01/31, 41 y.o.   MRN: [id number] Chief Complaint  Patient presents with  . Routine Post Op    post op 1, Left knee, DOS 03/16/12    BP 130/80  Ht 6\' 5"  (1.956 m)  Wt 304 lb (137.893 kg)  BMI 36.05 kg/m2  PROCEDURE: Procedure(s) (LRB):  KNEE ARTHROSCOPY WITH LATERAL MENISECTOMY (Left)  ARTHROSCOPY KNEE (drilling and abrasion arthroplasty trochlea)  FINDINGS:  GRADE 4 TROCHLEA LESION  OLD MEDIAL MENISECTOMY - NORMAL  NEW LATERAL MENISCUS TEAR  Overall he is doing very well his knee looks good and his portals are clean he is mild swelling which is expected after microfracture  Recommend start physical therapy follow up on August 5, full weightbearing as tolerated quadriceps exercises should be initiated along with range of motion exercises with the concentration on regaining full range of motion and decreasing swelling

## 2012-03-20 ENCOUNTER — Encounter (HOSPITAL_COMMUNITY): Payer: Self-pay | Admitting: Orthopedic Surgery

## 2012-03-26 ENCOUNTER — Ambulatory Visit (HOSPITAL_COMMUNITY)
Admission: RE | Admit: 2012-03-26 | Discharge: 2012-03-26 | Disposition: A | Discharge status: Home / Self Care | Payer: PRIVATE HEALTH INSURANCE | Source: Ambulatory Visit | Attending: Orthopedic Surgery | Admitting: Orthopedic Surgery

## 2012-03-26 DIAGNOSIS — IMO0001 Reserved for inherently not codable concepts without codable children: Secondary | ICD-10-CM | POA: Insufficient documentation

## 2012-03-26 DIAGNOSIS — M6281 Muscle weakness (generalized): Secondary | ICD-10-CM | POA: Insufficient documentation

## 2012-03-26 DIAGNOSIS — M25569 Pain in unspecified knee: Secondary | ICD-10-CM | POA: Insufficient documentation

## 2012-03-26 DIAGNOSIS — M25469 Effusion, unspecified knee: Secondary | ICD-10-CM | POA: Insufficient documentation

## 2012-03-26 NOTE — Evaluation (Signed)
Physical Therapy Evaluation  Patient Details  Name: Jose [name] MRN: [id number] Date of Birth: 05/19/1971  Today's Date: 03/26/2012 Time: 4098-1191 PT Time Calculation (min): 42 min  Visit#: 1  of 16   Re-eval: 04/25/12 Assessment Diagnosis: s/p SALK Surgical Date: 03/16/12 Next MD Visit:  (04/09/2012)  Authorization: inclusive  Past Medical History:  Past Medical History  Diagnosis Date  . GERD (gastroesophageal reflux disease)   . Hypertension   . Seasonal allergies    Past Surgical History:  Past Surgical History  Procedure Date  . Colonoscopy     by Dr. Lane Hacker polyps  . Esophagogastroduodenoscopy 05/2009    Schatzki's ring, s/p dilatation + eosinophilic  . Cholecystectomy 04/2009  . Umbilical hernia repair   . Knee surgery     left  . Cholecystectomy   . Knee arthroscopy 03/16/2012    Procedure: ARTHROSCOPY KNEE;  Surgeon: Vickki Hearing, MD;  Location: [facility];  Service: Orthopedics;  Laterality: Left;  drilling and abrasion, arthroplasty of trochlea    Subjective Symptoms/Limitations Symptoms: Mr. Doren states that he had pain in his L knee for years.  He opted to have arthroscopic surgery.  He currently is still having quite a bit of functional difficulty therefore he has been referred to PT Pertinent History: The patient states that he had an arthroscopy in 1997 on the same knee.  The patient states he had a partial ACL tear this time and Dr. Romeo Apple stated that he drilled 10 holes into his marrow to attempt to prolong his knee. How long can you sit comfortably?: Mr. Abdallah states that he is able to sit for five or ten minutes. How long can you stand comfortably?: Mr. Alcoser states that he is able to stand for five minutes. How long can you walk comfortably?: Mr. Deneault states that he is able to walk for ten minutes at a time at this time. Special Tests: The patient states he is still sleeping in the recliner with a pillow under his knee. Patient Stated  Goals: The patient want to get back to his normal activities. Pain Assessment Currently in Pain?: Yes Pain Score:   2 (worst pain 5/10; best 2/10) Pain Location: Knee Pain Orientation: Left Pain Type: Surgical pain Pain Onset: 1 to 4 weeks ago Pain Frequency: Constant Pain Relieving Factors: cryocuff, alleve. 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 Supine Quad Sets: 10 reps Heel Slides: 10 reps Prone  Hamstring Curl: 10 reps;Limitations Hamstring Curl Limitations: 3# Hip Extension: 10 reps;Limitations Hip Extension Limitations: 3#    Physical Therapy Assessment and Plan PT Assessment and Plan Clinical Impression Statement: Pt with decreased ROM, strength and increased pain who will benefit from skilled PT to return pt to  prior funcitonal level. Pt will benefit from skilled therapeutic intervention in order to improve on the following deficits: Decreased activity tolerance;Decreased balance;Decreased strength;Difficulty walking;Pain;Increased edema Rehab Potential: Good PT Frequency: Min 3X/week PT Duration: 6 weeks PT  Treatment/Interventions: Gait training;Therapeutic activities;Therapeutic exercise;Balance training;Patient/family education PT Plan: Begin heel raises, rockerboard, minisquat, terminal knee extension and elliptical next treatment, 3rd treatment begin SLS, lateral step up, forward step ups,    Goals PT Short Term Goals Time to Complete Short Term Goals: 3 weeks PT Short Term Goal 1: ROM 0 to 130 to allow normal walking and squating activities. PT Short Term Goal 2: Pt to be able to sit for 40 minutes with comfort PT Short Term Goal 3: Pt to be able to stand for 20 minutes at a time. PT Short Term Goal 4: Pt to be able to walk for 30 minutes without increased pain PT Short Term Goal 5: Pain level no greater than a 3 PT Long Term Goals Time to Complete Long Term Goals:  (6 wks) PT Long Term Goal 1: Pt strength to be wnl PT Long Term Goal 2: Pt to be able to stand for 60 minutes Long Term Goal 3: Pt to be able to walk for 60 minutes without increased pain Long Term Goal 4: Pt to be able to squat and pick up 40# without increased pain to be able to RTW PT Long Term Goal 5: Pain no greater than a one  Problem List Patient Active Problem List  Diagnosis  . GERD  . LIVER FUNCTION TESTS, ABNORMAL, HX OF  . ABDOMINAL PAIN  . ABDOMINAL PAIN RIGHT UPPER QUADRANT  . ABDOMINAL PAIN-MULTIPLE SITES  . Knee pain  . Effusion of knee joint, left  . OA (osteoarthritis) of knee  . Medial meniscus, posterior horn derangement  . Cruciate ligament, anterior, disruption, old    PT - End of Session Activity Tolerance: Patient tolerated treatment well General Behavior During Session: [facility] for tasks performed Cognition: [facility] for tasks performed PT Plan of Care PT Home Exercise Plan: given PT Patient Instructions: given  GP    RUSSELL,CINDY 03/26/2012, 2:57 PM  Physician Documentation Your signature is required to indicate approval of the treatment plan as stated above.  Please sign and either  send electronically or make a copy of this report for your files and return this physician signed original.   Please mark one 1.__approve of plan  2. ___approve of plan with the following conditions.   ______________________________                                                          _____________________ Physician Signature                                                                                                             Date

## 2012-03-28 ENCOUNTER — Ambulatory Visit (HOSPITAL_COMMUNITY)
Admission: RE | Admit: 2012-03-28 | Discharge: 2012-03-28 | Disposition: A | Discharge status: Home / Self Care | Payer: PRIVATE HEALTH INSURANCE | Source: Ambulatory Visit | Attending: Internal Medicine | Admitting: Internal Medicine

## 2012-03-28 NOTE — Progress Notes (Signed)
Physical Therapy Treatment Patient Details  Name: Jose [name] MRN: [id number] Date of Birth: [birth year]  Today's Date: 03/28/2012 Time: 7829-5621 PT Time Calculation (min): 35 min 34' therex  Visit#: 2  of 16   Re-eval: 04/25/12    Authorization:    Authorization Time Period:    Authorization Visit#:   of     Subjective: Symptoms/Limitations Symptoms: Pt has no complaints this morning.  Precautions/Restrictions     Exercise/Treatments Mobility/Balance        Stretches   Aerobic Elliptical: 4' @ 2.0;patient needed to stop due to 3/10 pain of patella [employer] for Strengthening   Plyometrics   Standing Heel Raises: 10 reps Terminal Knee Extension: 15 reps;Theraband Rocker Board: 1 minute Other Standing Knee Exercises: minisquats x10 Seated Long Arc Quad: 10 reps;Weights Long Arc Quad Weight: 3 lbs. Supine Quad Sets: 10 reps Heel Slides: 10 reps Straight Leg Raises: 10 reps Sidelying Hip ABduction: 10 reps Hip ADduction: 10 reps Prone  Hamstring Curl: 15 reps Hamstring Curl Limitations: 3#;place towel distal thigh Hip Extension: 15 reps Hip Extension Limitations: 3#;place towel distal thigh      Physical Therapy Assessment and Plan PT Assessment and Plan Clinical Impression Statement: Patient did well with all exercises. Attempted to have pt complete 6' on ellipitcal, however pt fatigued and experience 3/10 patellar pain that required him to stop. Next treatment add SLS, lateral step up, forward step ups.  Patient reports knee feeling "tight" at end of session, no pain.  Goals    Problem List Patient Active Problem List  Diagnosis  . GERD  . LIVER FUNCTION TESTS, ABNORMAL, HX OF  . ABDOMINAL PAIN  . ABDOMINAL PAIN RIGHT UPPER QUADRANT  . ABDOMINAL PAIN-MULTIPLE SITES  . Knee pain  . Effusion of knee joint, left  . OA (osteoarthritis) of knee  . Medial meniscus, posterior horn derangement  . Cruciate ligament, anterior, disruption, old     PT - End of Session Activity Tolerance: Patient tolerated treatment well General Behavior During Session: [facility] for tasks performed Cognition: [facility] for tasks performed  GP    Diara Chaudhari ATKINSO 03/28/2012, 8:43 AM

## 2012-03-30 ENCOUNTER — Ambulatory Visit (HOSPITAL_COMMUNITY): Payer: PRIVATE HEALTH INSURANCE | Admitting: Physical Therapy

## 2012-04-02 ENCOUNTER — Ambulatory Visit (HOSPITAL_COMMUNITY)
Admission: RE | Admit: 2012-04-02 | Discharge: 2012-04-02 | Disposition: A | Discharge status: Home / Self Care | Payer: PRIVATE HEALTH INSURANCE | Source: Ambulatory Visit | Attending: *Deleted | Admitting: *Deleted

## 2012-04-02 NOTE — Progress Notes (Signed)
Physical Therapy Treatment Patient Details  Name: Jose [name] MRN: [id number] Date of Birth: 07/25/1971  Today's Date: 04/02/2012 Time: 8295-6213 PT Time Calculation (min): 43 min  Visit#: 3  of 16   Re-eval: 04/25/12 Charges: Therex x 38'  Subjective: Symptoms/Limitations Symptoms: Pt reports HEP compliance. Pain Assessment Currently in Pain?: No/denies Pain Score: 0-No pain   Exercise/Treatments Aerobic Elliptical: 4' @ 2.0 without increased pain Standing Heel Raises: 15 reps;Limitations Heel Raises Limitations: Toe raises x 15 Terminal Knee Extension: 15 reps;Theraband Lateral Step Up: 10 reps;Step Height: 4";Hand Hold: 2;Left Forward Step Up: 10 reps;Hand Hold: 0;Step Height: 4";Left Rocker Board: 2 minutes SLS: L: 9" max of 3 Other Standing Knee Exercises: minisquats x10 Seated Long Arc Quad: 15 reps Long Arc Quad Weight: 3 lbs. Supine Short Arc Quad Sets: 15 reps;Left;Limitations Short Arc [employer] Limitations: 5" holds Bridges: 15 reps;Limitations Bridges Limitations: 5" holds Straight Leg Raises: 10 reps Sidelying Hip ABduction: 15 reps Hip ADduction: 15 reps Prone  Hamstring Curl: 15 reps Hamstring Curl Limitations: 3# Hip Extension: 15 reps Hip Extension Limitations: 3#    Physical Therapy Assessment and Plan PT Assessment and Plan Clinical Impression Statement: Pt completes therex well with minimal need for cueing. Visible quad fatigue noted with knee ext exercises. Pt able to complete elliptical without difficulty or increased pain. Pt is without complaint throughout session and reports 0/10 pain at end of session. PT Plan: Conitnue per PT POC. Begin forward step downs next session.     Problem List Patient Active Problem List  Diagnosis  . GERD  . LIVER FUNCTION TESTS, ABNORMAL, HX OF  . ABDOMINAL PAIN  . ABDOMINAL PAIN RIGHT UPPER QUADRANT  . ABDOMINAL PAIN-MULTIPLE SITES  . Knee pain  . Effusion of knee joint, left  . OA  (osteoarthritis) of knee  . Medial meniscus, posterior horn derangement  . Cruciate ligament, anterior, disruption, old    PT - End of Session Activity Tolerance: Patient tolerated treatment well General Behavior During Session: [facility] for tasks performed Cognition: [facility] for tasks performed   Seth Bake, PTA 04/02/2012, 8:52 AM

## 2012-04-04 ENCOUNTER — Ambulatory Visit (HOSPITAL_COMMUNITY)
Admission: RE | Admit: 2012-04-04 | Discharge: 2012-04-04 | Disposition: A | Discharge status: Home / Self Care | Payer: PRIVATE HEALTH INSURANCE | Source: Ambulatory Visit | Attending: Internal Medicine | Admitting: Internal Medicine

## 2012-04-04 NOTE — Progress Notes (Signed)
Physical Therapy Treatment Patient Details  Name: Jose [name] MRN: [id number] Date of Birth: Nov 18, 1970  Today's Date: 04/04/2012 Time: 4098-1191 PT Time Calculation (min): 42 min  Visit#: 4  of 16   Re-eval: 04/25/12 Charges: Therex x 35'   Subjective: Symptoms/Limitations Symptoms: Pt reports some soreness after last session that subsided after a day or two. Pain Assessment Currently in Pain?: No/denies Pain Score: 0-No pain   Exercise/Treatments Aerobic Elliptical: 5' @ 2.0 without increased pain Standing Heel Raises: 15 reps;Limitations Heel Raises Limitations: Toe raises x 15 Lateral Step Up: 10 reps;Step Height: 4";Hand Hold: 2;Left Forward Step Up: 10 reps;Hand Hold: 0;Step Height: 4";Left Step Down: 10 reps;Hand Hold: 1;Step Height: 4";Left Rocker Board: 2 minutes SLS: L: 25" max of 3 Other Standing Knee Exercises: minisquats x15 Seated Long Arc Quad: 15 reps Long Arc Quad Weight: 3 lbs. Supine Short Arc Quad Sets: 15 reps;Left;Limitations Short Arc [employer] Limitations: 5" holds with 3# Bridges: 15 reps;Limitations Bridges Limitations: 5" holds Straight Leg Raises: 15 reps Sidelying Hip ABduction: 15 reps Hip ADduction: 15 reps Prone  Hamstring Curl: 20 reps Hamstring Curl Limitations: 5# Hip Extension: 15 reps Hip Extension Limitations: 3#   Physical Therapy Assessment and Plan PT Assessment and Plan Clinical Impression Statement: Pt continues to tolerate therex well. Less mm quaking with knee ext exercises noted. Began forward step downs to improve eccentric quad control with minimal difficulty. Pt denies need for ice and reports no pain at end of session.  PT Plan: Conitnue per PT POC.      Problem List Patient Active Problem List  Diagnosis  . GERD  . LIVER FUNCTION TESTS, ABNORMAL, HX OF  . ABDOMINAL PAIN  . ABDOMINAL PAIN RIGHT UPPER QUADRANT  . ABDOMINAL PAIN-MULTIPLE SITES  . Knee pain  . Effusion of knee joint, left  . OA  (osteoarthritis) of knee  . Medial meniscus, posterior horn derangement  . Cruciate ligament, anterior, disruption, old    PT - End of Session Activity Tolerance: Patient tolerated treatment well General Behavior During Session: [facility] for tasks performed Cognition: [facility] for tasks performed   Seth Bake, PTA 04/04/2012, 8:53 AM

## 2012-04-06 ENCOUNTER — Ambulatory Visit (HOSPITAL_COMMUNITY)
Admission: RE | Admit: 2012-04-06 | Discharge: 2012-04-06 | Disposition: A | Discharge status: Home / Self Care | Payer: PRIVATE HEALTH INSURANCE | Source: Ambulatory Visit | Attending: Internal Medicine | Admitting: Internal Medicine

## 2012-04-06 DIAGNOSIS — M25569 Pain in unspecified knee: Secondary | ICD-10-CM | POA: Insufficient documentation

## 2012-04-06 DIAGNOSIS — IMO0001 Reserved for inherently not codable concepts without codable children: Secondary | ICD-10-CM | POA: Insufficient documentation

## 2012-04-06 DIAGNOSIS — M6281 Muscle weakness (generalized): Secondary | ICD-10-CM | POA: Insufficient documentation

## 2012-04-06 DIAGNOSIS — M25469 Effusion, unspecified knee: Secondary | ICD-10-CM | POA: Insufficient documentation

## 2012-04-06 NOTE — Evaluation (Signed)
Physical Therapy Re-evaluation  Patient Details  Name: Jose [name] MRN: [id number] Date of Birth: [birth year]  Today's Date: 04/06/2012 Time: 4098-1191 PT Time Calculation (min): 57 min  Visit#: 5  of 16   Re-eval: 04/25/12 Assessment Diagnosis: s/p SALK Surgical Date: 03/16/12 Next MD Visit:  (04/09/2012)   Past Medical History:  Past Medical History  Diagnosis Date  . GERD (gastroesophageal reflux disease)   . Hypertension   . Seasonal allergies    Past Surgical History:  Past Surgical History  Procedure Date  . Colonoscopy     by Dr. Lane Hacker polyps  . Esophagogastroduodenoscopy 05/2009    Schatzki's ring, s/p dilatation + eosinophilic  . Cholecystectomy 04/2009  . Umbilical hernia repair   . Knee surgery     left  . Cholecystectomy   . Knee arthroscopy 03/16/2012    Procedure: ARTHROSCOPY KNEE;  Surgeon: Vickki Hearing, MD;  Location: [facility];  Service: Orthopedics;  Laterality: Left;  drilling and abrasion, arthroplasty of trochlea    Subjective Symptoms/Limitations Symptoms: It's feeling stronger every day How long can you sit comfortably?: Pt states that he is able to sit as long as he wants to now;  was 5-10 minutes How long can you stand comfortably?: Pt states that he is able to stand for 15 minutes was five. How long can you walk comfortably?: The patient states he is has walked for 15 minutes at this time he was through with his task no fatigue;  Was having fatigue after ten minutes. Special Tests: The patient is sleeping in his bed now was sleeping in the recliner. 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#, walk, squat,  Leisure: Hobbies-yes (Comment)      Sensation/Coordination/Flexibility/Functional Tests Functional Tests Functional Tests: LEFS 58/80  Assessment LLE AROM (degrees) Left Knee Extension: 4  (was 10) Left Knee Flexion: 130  (was 120) LLE PROM (degrees) Left Knee Extension: 8 Left Knee Flexion: 123 LLE Strength Left Hip Flexion: 5/5 Left Hip Extension: 4/5 (was 3+) Left Hip ABduction: 5/5 Left Hip ADduction: 5/5 (was 4/5) Left Knee Flexion: 4/5 (was 3+) Left Knee Extension: 5/5 (was 3+) Left Ankle Dorsiflexion: 5/5  Exercise/Treatments   Stretches Active Hamstring Stretch: 3 reps;30 seconds Aerobic Elliptical: 7'@ 3.0   Standing Heel Raises: 15 reps;Limitations Heel Raises Limitations: up both down on L  Knee Flexion: Strengthening;Left;10 reps;Limitations Knee Flexion Limitations: 3# Terminal Knee Extension: 15 reps Lateral Step Up: Left;10 reps;Step Height: 4" Forward Step Up: Left;10 reps;Hand Hold: 0;Step Height: 6" (circuit 4 and 6" step up left down right x 5 rt) Step Down: Left;10 reps;Step Height: 4" Rocker Board: 2 minutes SLS with Vectors:  (30" x 2) Other Standing Knee Exercises: minisquats x15 (minisquat picking ball off 6" step x 10) Seated Long Arc Quad: 15 reps Long Arc Quad Weight: 3 lbs. Supine Short Arc Quad Sets: 15 reps;Left;Limitations Terminal Knee Extension: Strengthening;Left;10 reps Bridges: 15 reps;Limitations Bridges Limitations: 5" holds Straight Leg Raises: 15 reps    Physical Therapy Assessment and Plan PT Assessment and Plan Clinical Impression Statement: Pt has visible fatiguing with squating activity;  Strength and ROM is improving; continue functional strengthening for RTW> PT Plan: begin lunging     Goals Home Exercise Program Pt will Perform Home Exercise Program: Independently  PT Short Term Goals PT Short Term Goal 1 - Progress: Met PT Short Term Goal 2 - Progress: Met PT Short Term Goal 3 - Progress: Progressing toward goal PT Short Term Goal 4 -  Progress: Progressing toward goal PT Long Term Goals PT Long Term Goal 1 - Progress: Met PT Long Term Goal 2 - Progress: Not met Long Term Goal 3 Progress: Not met Long Term Goal 4 Progress: Not met Long Term Goal 5 Progress: Met  Problem List Patient Active Problem List  Diagnosis  . GERD  . LIVER FUNCTION TESTS, ABNORMAL, HX OF  . ABDOMINAL PAIN  . ABDOMINAL PAIN RIGHT UPPER QUADRANT  . ABDOMINAL PAIN-MULTIPLE SITES  . Knee pain  . Effusion of knee joint, left  . OA (osteoarthritis) of knee  . Medial meniscus, posterior horn derangement  . Cruciate ligament, anterior, disruption, old    PT - End of Session Activity Tolerance: Patient tolerated treatment well General Behavior During Session: [facility] for tasks performed Cognition: [facility] for tasks performed     RUSSELL,CINDY 04/06/2012, 2:45 PM  Physician Documentation Your signature is required to indicate approval of the treatment plan as stated above.  Please sign and either send electronically or make a copy of this report for your files and return this physician signed original.   Please mark one 1.__approve of plan  2. ___approve of plan with the following conditions.   ______________________________                                                          _____________________ Physician Signature                                                                                                             Date

## 2012-04-09 ENCOUNTER — Ambulatory Visit (HOSPITAL_COMMUNITY)
Admission: RE | Admit: 2012-04-09 | Discharge: 2012-04-09 | Disposition: A | Discharge status: Home / Self Care | Payer: PRIVATE HEALTH INSURANCE | Source: Ambulatory Visit | Attending: Internal Medicine | Admitting: Internal Medicine

## 2012-04-09 ENCOUNTER — Ambulatory Visit (INDEPENDENT_AMBULATORY_CARE_PROVIDER_SITE_OTHER): Payer: PRIVATE HEALTH INSURANCE | Admitting: Orthopedic Surgery

## 2012-04-09 ENCOUNTER — Encounter: Payer: Self-pay | Admitting: Orthopedic Surgery

## 2012-04-09 VITALS — BP 120/90 | Ht 77.0 in | Wt 312.0 lb

## 2012-04-09 DIAGNOSIS — M1710 Unilateral primary osteoarthritis, unspecified knee: Secondary | ICD-10-CM

## 2012-04-09 NOTE — Progress Notes (Signed)
Patient ID: Jose [name], male   DOB: 06-Jan-1971, 41 y.o.   MRN: [id number] Routine Post Op        post op 2, Left knee, DOS 03/16/12     BP 130/80  Ht 6\' 5"  (1.956 m)  Wt 304 lb (137.893 kg)  BMI 36.05 kg/m2  PROCEDURE: Procedure(s) (LRB):   KNEE ARTHROSCOPY WITH LATERAL MENISECTOMY (Left)   ARTHROSCOPY KNEE (drilling and abrasion arthroplasty trochlea)   FINDINGS:   GRADE 4 TROCHLEA LESION   OLD MEDIAL MENISECTOMY - NORMAL   NEW LATERAL MENISCUS TEAR   Mild effusion. Appears to be progressing well. His range of motion is in no bracing at this time.  Recommendation continue physical therapy. Go easy on the strenuous exercises limited standing, and followup in 3 weeks for reevaluation and possible return to work

## 2012-04-09 NOTE — Progress Notes (Signed)
Physical Therapy Treatment Patient Details  Name: Jose [name] MRN: [id number] Date of Birth: [birth year]  Today's Date: 04/09/2012 Time: 2952-8413 PT Time Calculation (min): 39 min Charges: 35' TE Visit#: 6  of 16   Re-eval: 04/25/12  Subjective: Symptoms/Limitations Symptoms: Reports that he is doing pretty well.  He went to church this weekend and Dr. 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  He reports he is doing well with household activities, except difficulty with squatting.  reports he will have difficulty getting into a truck.   Exercise/Treatments Aerobic Elliptical: 8'@ 4.0 for strengthening Standing Terminal Knee Extension: 10 reps;Other (comment);Theraband (10 sec holds) Theraband Level (Terminal Knee Extension): Level 4 (Blue) Lateral Step Up: Left;15 reps;Step Height: 8" Forward Step Up: Left;15 reps;Step Height: 8" Functional Squat: 10 reps;Limitations Functional Squat Limitations: on BOS[employer] Wall Squat: 10 reps;10 seconds Lunge Walking - Round Trips: 1 RT Rocker Board: 2 minutes (CGA) Walking with Sports Cord: 4 ways down long halway, 1/2 trip each (more Prone  Other Prone Exercises: TKE 10x10 sec holds  Physical Therapy Assessment and Plan PT Assessment and Plan Clinical Impression Statement: Added exercises to improve hip strength and encourage functional knee fleixon.  Able to demonstrate all activities w/o complaints of knee pain, has visible fatigue w/exercises which require balance secondary to impaired hip strategy.  Overall is progressing well.  PT Plan: Cont to progress functional strength, f/u MD apt and on new exercises: lunging, sports cord and increased step height. Add stool scoots and pushing/puling sled when able.     Goals    Problem List Patient Active Problem List  Diagnosis  . GERD  . LIVER FUNCTION TESTS, ABNORMAL, HX OF  . ABDOMINAL PAIN  . ABDOMINAL PAIN RIGHT UPPER QUADRANT  . ABDOMINAL PAIN-MULTIPLE  SITES  . Knee pain  . Effusion of knee joint, left  . OA (osteoarthritis) of knee  . Medial meniscus, posterior horn derangement  . Cruciate ligament, anterior, disruption, old    PT - End of Session Activity Tolerance: Patient tolerated treatment well General Behavior During Session: [facility] for tasks performed Cognition: [facility] for tasks performed  Tenise Stetler 04/09/2012, 10:56 AM

## 2012-04-09 NOTE — Patient Instructions (Addendum)
Continue physical therapy:  Limit continuous standing   Continue ice packs

## 2012-04-11 ENCOUNTER — Ambulatory Visit (HOSPITAL_COMMUNITY)
Admission: RE | Admit: 2012-04-11 | Discharge: 2012-04-11 | Disposition: A | Discharge status: Home / Self Care | Payer: PRIVATE HEALTH INSURANCE | Source: Ambulatory Visit | Attending: Internal Medicine | Admitting: Internal Medicine

## 2012-04-11 NOTE — Progress Notes (Signed)
Physical Therapy Treatment Patient Details  Name: Jose [name] MRN: [id number] Date of Birth: [birth year]  Today's Date: 04/11/2012 Time: 7829-5621 PT Time Calculation (min): 39 min  Visit#: 7  of 16   Re-eval: 04/25/12  Charge: therex 39 min  Authorization: inclusive    Subjective: Symptoms/Limitations Symptoms: I'm feeling good today, no pain. Pain Assessment Currently in Pain?: No/denies  Objective:   Exercise/Treatments Aerobic Elliptical: 8'@ L4 for strengthening Standing Terminal Knee Extension: 15 reps;Theraband;Limitations Theraband Level (Terminal Knee Extension): Level 4 (Blue) Terminal Knee Extension Limitations: 10 second hold Lateral Step Up: Left;15 reps;Step Height: 8";Hand Hold: 0 Forward Step Up: Left;15 reps;Step Height: 8";Hand Hold: 0 Wall Squat: 10 reps;10 seconds Lunge Walking - Round Trips: Hydrographic surveyor: 2 minutes Seated Stool Scoot - Round Trips: 1RT on carpet push and pull Prone  Other Prone Exercises: TKE 10x10 sec holds      Physical Therapy Assessment and Plan PT Assessment and Plan Clinical Impression Statement: Added stool scoot for hamstring strengthening and to encourage knee flexion for functional activiites.  Pt able to complete all activiites with no c/o of pain.  Visible quad and hamstring fatigue noted through therex.   PT Plan: Continue to progress functional strength.  Add sled pushing/pulling when able.    Goals    Problem List Patient Active Problem List  Diagnosis  . GERD  . LIVER FUNCTION TESTS, ABNORMAL, HX OF  . ABDOMINAL PAIN  . ABDOMINAL PAIN RIGHT UPPER QUADRANT  . ABDOMINAL PAIN-MULTIPLE SITES  . Knee pain  . Effusion of knee joint, left  . OA (osteoarthritis) of knee  . Medial meniscus, posterior horn derangement  . Cruciate ligament, anterior, disruption, old    PT - End of Session Activity Tolerance: Patient tolerated treatment well General Behavior During Session: [facility] for tasks  performed Cognition: [facility] for tasks performed  GP    Juel Burrow 04/11/2012, 8:46 AM

## 2012-04-13 ENCOUNTER — Ambulatory Visit (HOSPITAL_COMMUNITY)
Admission: RE | Admit: 2012-04-13 | Discharge: 2012-04-13 | Disposition: A | Discharge status: Home / Self Care | Payer: PRIVATE HEALTH INSURANCE | Source: Ambulatory Visit | Attending: Internal Medicine | Admitting: Internal Medicine

## 2012-04-13 NOTE — Progress Notes (Signed)
Physical Therapy Treatment Patient Details  Name: Jose [name] MRN: [id number] Date of Birth: 1970/11/27  Today's Date: 04/13/2012 Time: 1015-1102 PT Time Calculation (min): 47 min  Visit#: 8  of 16   Re-eval: 04/25/12  Charge: therex 39 min  Subjective: Symptoms/Limitations Symptoms: I'm feeling good today, no pain following last session. Pain Assessment Currently in Pain?: No/denies  Objective:   Exercise/Treatments Aerobic Elliptical: 8'@ L4 for strengthening Machines for Strengthening Cybex Knee Extension: not needed Cybex Knee Flexion: 7.5 Pl 15 reps Standing Terminal Knee Extension: 15 reps;Theraband;Limitations Theraband Level (Terminal Knee Extension): Level 4 (Blue) Terminal Knee Extension Limitations: 10 second hold Lateral Step Up: Left;10 reps;Hand Hold: 0;Limitations Lateral Step Up Limitations: on BOSU Forward Step Up: Left;10 reps;Hand Hold: 0;Limitations Forward Step Up Limitations: on BOSU Step Down: Left;10 reps;Hand Hold: 0;Limitations Step Down Limitations: on BOSU Wall Squat: 10 reps;10 seconds Lunge Walking - Round Trips: 2RT Walking with Sports Cord: thick sports cord, 5 reps forwards, retro, and L side stepping Seated Stool Scoot - Round Trips: 2 RT push/pull on carpet    Physical Therapy Assessment and Plan PT Assessment and Plan Clinical Impression Statement: Changed step up to dynamic surface to address the ankle and hip strategy, improve balance and increase overall knee mm strength.  Pt able to complete all activities with min assistance for LOB episodes with no c/o of pain.  Visible fatigue noted with the increased activity tolerance demand. PT Plan: Continue progressing functional strength.  Add sled push/pull when ready.    Goals    Problem List Patient Active Problem List  Diagnosis  . GERD  . LIVER FUNCTION TESTS, ABNORMAL, HX OF  . ABDOMINAL PAIN  . ABDOMINAL PAIN RIGHT UPPER QUADRANT  . ABDOMINAL PAIN-MULTIPLE SITES  .  Knee pain  . Effusion of knee joint, left  . OA (osteoarthritis) of knee  . Medial meniscus, posterior horn derangement  . Cruciate ligament, anterior, disruption, old    PT - End of Session Activity Tolerance: Patient tolerated treatment well General Behavior During Session: [facility] for tasks performed Cognition: [facility] for tasks performed  GP    Juel Burrow 04/13/2012, 11:28 AM

## 2012-04-23 ENCOUNTER — Ambulatory Visit (HOSPITAL_COMMUNITY)
Admission: RE | Admit: 2012-04-23 | Discharge: 2012-04-23 | Disposition: A | Discharge status: Home / Self Care | Payer: PRIVATE HEALTH INSURANCE | Source: Ambulatory Visit | Attending: Internal Medicine | Admitting: Internal Medicine

## 2012-04-23 NOTE — Progress Notes (Signed)
Physical Therapy Treatment Patient Details  Name: Jose [name] MRN: [id number] Date of Birth: Nov 09, 1970  Today's Date: 04/23/2012 Time: 4098-1191 PT Time Calculation (min): 44 min Visit#: 9  of 16   Re-eval: 04/25/12 Charges:  Therex 42'    Subjective: Symptoms/Limitations Symptoms: Pt. states he has not had any pain in a while.  Returns to MD in 1 week; doing well. Pain Assessment Currently in Pain?: No/denies   Exercise/Treatments Aerobic Elliptical: 8'@ L4 for strengthening Machines for Strengthening Cybex Knee Flexion: 7.5 Pl 2X15 reps Standing Terminal Knee Extension: 15 reps;Theraband;Limitations Theraband Level (Terminal Knee Extension): Level 4 (Blue) Terminal Knee Extension Limitations: 10 second hold Lateral Step Up: Left;10 reps;Hand Hold: 0;Limitations Lateral Step Up Limitations: on BOSU Forward Step Up: Left;10 reps;Hand Hold: 0;Limitations Forward Step Up Limitations: on BOSU Step Down: Left;10 reps;Hand Hold: 0;Limitations Step Down Limitations: on BOSU Wall Squat: 10 reps;10 seconds Lunge Walking - Round Trips: 2RT    Physical Therapy Assessment and Plan PT Assessment and Plan Clinical Impression Statement: Pt. without difficulty performing exercises today.  Returns to MD X 1 week with anticipated release from therapy. PT Plan: Re-evaluate next visit.     Problem List Patient Active Problem List  Diagnosis  . GERD  . LIVER FUNCTION TESTS, ABNORMAL, HX OF  . ABDOMINAL PAIN  . ABDOMINAL PAIN RIGHT UPPER QUADRANT  . ABDOMINAL PAIN-MULTIPLE SITES  . Knee pain  . Effusion of knee joint, left  . OA (osteoarthritis) of knee  . Medial meniscus, posterior horn derangement  . Cruciate ligament, anterior, disruption, old    PT - End of Session Activity Tolerance: Patient tolerated treatment well General Behavior During Session: [facility] for tasks performed Cognition: [facility] for tasks performed   Lurena Nida, PTA/CLT 04/23/2012, 8:54 AM

## 2012-04-25 ENCOUNTER — Inpatient Hospital Stay (HOSPITAL_COMMUNITY): Admission: RE | Admit: 2012-04-25 | Payer: PRIVATE HEALTH INSURANCE | Source: Ambulatory Visit

## 2012-04-27 ENCOUNTER — Inpatient Hospital Stay (HOSPITAL_COMMUNITY): Admission: RE | Admit: 2012-04-27 | Payer: PRIVATE HEALTH INSURANCE | Source: Ambulatory Visit

## 2012-04-30 ENCOUNTER — Inpatient Hospital Stay (HOSPITAL_COMMUNITY)
Admission: RE | Admit: 2012-04-30 | Payer: PRIVATE HEALTH INSURANCE | Source: Ambulatory Visit | Admitting: Physical Therapy

## 2012-04-30 ENCOUNTER — Ambulatory Visit (INDEPENDENT_AMBULATORY_CARE_PROVIDER_SITE_OTHER): Payer: PRIVATE HEALTH INSURANCE | Admitting: Orthopedic Surgery

## 2012-04-30 ENCOUNTER — Encounter: Payer: Self-pay | Admitting: Orthopedic Surgery

## 2012-04-30 VITALS — BP 112/70 | Ht 77.0 in | Wt 312.0 lb

## 2012-04-30 DIAGNOSIS — Z9889 Other specified postprocedural states: Secondary | ICD-10-CM

## 2012-04-30 NOTE — Progress Notes (Signed)
Patient ID: Jose [name], male   DOB: May 06, 1971, 41 y.o.   MRN: [id number] Chief Complaint  Patient presents with  . Wound Check    recheck Left knee, DOS 03/16/12    BP 112/70  Ht 6\' 5"  (1.956 m)  Wt 312 lb (141.522 kg)  BMI 37.00 kg/m2  Status post left knee arthroscopy we did find several areas of arthritic change but he has done remarkably well most likely due to his compliance. He would like to go back to work later in September. He is having some difficulty with his right knee so we will look at that in October with x-rays and reevaluation including clinical exam  Otherwise his left knee is doing well no swelling full range of motion minimal to no pain  Followup in October

## 2012-04-30 NOTE — Patient Instructions (Addendum)
Schedule appt. for right knee x-rays new problem sometime OCT

## 2012-05-02 ENCOUNTER — Ambulatory Visit (HOSPITAL_COMMUNITY): Payer: PRIVATE HEALTH INSURANCE | Admitting: Physical Therapy

## 2012-05-04 ENCOUNTER — Ambulatory Visit (HOSPITAL_COMMUNITY): Payer: PRIVATE HEALTH INSURANCE

## 2012-05-09 ENCOUNTER — Ambulatory Visit (HOSPITAL_COMMUNITY): Payer: PRIVATE HEALTH INSURANCE | Admitting: Physical Therapy

## 2012-05-11 ENCOUNTER — Ambulatory Visit (HOSPITAL_COMMUNITY): Payer: PRIVATE HEALTH INSURANCE | Admitting: Physical Therapy

## 2012-05-16 ENCOUNTER — Telehealth (HOSPITAL_COMMUNITY): Payer: Self-pay | Admitting: Physical Therapy

## 2012-05-16 NOTE — Telephone Encounter (Signed)
Message copied by Bella Kennedy on Wed May 16, 2012  1:09 PM ------      Message from: [name] [place] E      Created: Mon Apr 09, 2012  4:34 PM      Regarding: rehab        Go easy on the rehab:            Especially the rigorous exercises

## 2012-06-14 ENCOUNTER — Ambulatory Visit: Payer: PRIVATE HEALTH INSURANCE | Admitting: Orthopedic Surgery

## 2013-01-24 ENCOUNTER — Other Ambulatory Visit (HOSPITAL_COMMUNITY): Payer: Self-pay | Admitting: Family Medicine

## 2013-01-24 DIAGNOSIS — R7989 Other specified abnormal findings of blood chemistry: Secondary | ICD-10-CM

## 2013-01-30 ENCOUNTER — Ambulatory Visit (HOSPITAL_COMMUNITY)
Admission: RE | Admit: 2013-01-30 | Discharge: 2013-01-30 | Disposition: A | Discharge status: Home / Self Care | Payer: No Typology Code available for payment source | Source: Ambulatory Visit | Attending: Family Medicine | Admitting: Family Medicine

## 2013-01-30 DIAGNOSIS — R7989 Other specified abnormal findings of blood chemistry: Secondary | ICD-10-CM

## 2013-01-30 DIAGNOSIS — R748 Abnormal levels of other serum enzymes: Secondary | ICD-10-CM | POA: Insufficient documentation

## 2014-05-27 ENCOUNTER — Encounter: Payer: Self-pay | Admitting: Neurology

## 2014-05-27 ENCOUNTER — Encounter (INDEPENDENT_AMBULATORY_CARE_PROVIDER_SITE_OTHER): Payer: Self-pay

## 2014-05-27 ENCOUNTER — Ambulatory Visit (INDEPENDENT_AMBULATORY_CARE_PROVIDER_SITE_OTHER): Payer: BC Managed Care – PPO | Admitting: Neurology

## 2014-05-27 VITALS — BP 138/84 | HR 101 | Temp 97.0°F | Ht 76.0 in | Wt 336.0 lb

## 2014-05-27 DIAGNOSIS — G2581 Restless legs syndrome: Secondary | ICD-10-CM

## 2014-05-27 DIAGNOSIS — R51 Headache: Secondary | ICD-10-CM

## 2014-05-27 DIAGNOSIS — G4761 Periodic limb movement disorder: Secondary | ICD-10-CM

## 2014-05-27 DIAGNOSIS — R400 Somnolence: Secondary | ICD-10-CM

## 2014-05-27 DIAGNOSIS — R519 Headache, unspecified: Secondary | ICD-10-CM

## 2014-05-27 DIAGNOSIS — R351 Nocturia: Secondary | ICD-10-CM

## 2014-05-27 DIAGNOSIS — R404 Transient alteration of awareness: Secondary | ICD-10-CM

## 2014-05-27 DIAGNOSIS — G4733 Obstructive sleep apnea (adult) (pediatric): Secondary | ICD-10-CM

## 2014-05-27 NOTE — Patient Instructions (Signed)

## 2014-05-27 NOTE — Progress Notes (Signed)
Subjective:    Patient ID: Jose [name] is a 43 y.o. male.  HPI    Star Age, MD, PhD [facility] [address], [address] [address] [place], [place] [address]  Dear Dr. Gerarda Fraction,   I saw your patient, Jose [name], upon your kind request in my neurologic clinic today for initial consultation of his sleep disorder, in particular, concern for underlying obstructive sleep apnea. The patient is accompanied by his wife today. As you know, Jose [name] is a 43 year old right-handed gentleman with an underlying medical history of hypertension, osteoarthritis, s/p 2 knee surgeries (1997 and 2013), hyperlipidemia, and morbid obesity, who reports snoring and witnessed apneas and severe EDS.  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 He has gained weight. He has had some chest pain and just today saw a new cardiologist in [place]. He is scheduled for a stress study. He often wakes up with a headache. He does not drink caffeine regularly. He is a lifelong non-smoker and does not drink alcohol. He may have mild RLS symptoms and has occasional leg twitching. He has nocturia 2-3 times per night. He watches TV in bed. They have 2 dogs in the bed with them.   His typical bedtime is reported to be around 9:30 PM and usual wake time is around 7 AM. Sleep onset typically occurs within minutes. He reports feeling poorly rested upon awakening. He reports excessive daytime somnolence (EDS) and His Epworth Sleepiness Score (ESS) is 22/24 today. He has not fallen asleep while driving. The patient has not been taking a scheduled nap, but takes unscheduled naps.  He has been known to snore for the past many years. Snoring is reportedly severe, and associated with choking sounds and witnessed apneas. The patient admits to a sense of choking or strangling feeling. There is report of nighttime reflux, with occasional nighttime cough experienced. The patient has noted mild RLS symptoms and is known  to kick while asleep or before falling asleep. There is no family history of RLS or OSA.   He is a restless sleeper and in the morning, the bed is quite disheveled.   He denies cataplexy, sleep paralysis, hypnagogic or hypnopompic hallucinations, or sleep attacks. He does not report any vivid dreams, nightmares, dream enactments, or parasomnias, such as sleep talking or sleep walking. The patient has not had a sleep study or a home sleep test.   His Past Medical History Is Significant For: Past Medical History  Diagnosis Date  . GERD (gastroesophageal reflux disease)   . Hypertension   . Seasonal allergies   . Diverticulitis     His Past Surgical History Is Significant For: Past Surgical History  Procedure Laterality Date  . Colonoscopy      by Dr. Kem Boroughs polyps  . Esophagogastroduodenoscopy  05/2009    Schatzki's ring, s/p dilatation + eosinophilic  . Cholecystectomy  04/2009  . Umbilical hernia repair    . Knee surgery      left  . Cholecystectomy    . Knee arthroscopy  03/16/2012    Procedure: ARTHROSCOPY KNEE;  Surgeon: Carole Civil, MD;  Location: [facility];  Service: Orthopedics;  Laterality: Left;  drilling and abrasion, arthroplasty of trochlea    His Family History Is Significant For: Family History  Problem Relation Age of Onset  . Heart disease    . Cancer    . Heart disease Mother   . Diabetes Mother   . Cancer Father  prostate    His Social History Is Significant For: History   Social History  . Marital Status: Married    Spouse Name: Jose [name]    Number of Children: 2  . Years of Education: 12   Occupational History  .      [employer]   Social History Main Topics  . Smoking status: Never Smoker   . Smokeless tobacco: Never Used  . Alcohol Use: No  . Drug Use: No  . Sexual Activity: None   Other Topics Concern  . None   Social History Narrative  . None    His Allergies Are:  Allergies  Allergen Reactions  . Shellfish Allergy  Anaphylaxis  . Penicillins     Unknown,reaction as a child  :   His Current Medications Are:  Outpatient Encounter Prescriptions as of 05/27/2014  Medication Sig  . aspirin 81 MG EC tablet Take 81 mg by mouth.  . Dextromethorphan-Guaifenesin (MUCINEX DM) 30-600 MG TB12 Take by mouth daily.  Marland Kitchen esomeprazole (NEXIUM) 20 MG capsule Take 22.3 mg by mouth.  . fexofenadine (ALLEGRA) 180 MG tablet Take 180 mg by mouth daily as needed. For allergies  . hydrochlorothiazide (,MICROZIDE/HYDRODIURIL,) 12.5 MG capsule Take 12.5 mg by mouth every morning.   Marland Kitchen losartan (COZAAR) 100 MG tablet Take 100 mg by mouth.  . losartan (COZAAR) 100 MG tablet 100 mg.  . [DISCONTINUED] enalapril (VASOTEC) 10 MG tablet Take 10 mg by mouth every morning.   . [DISCONTINUED] promethazine (PHENERGAN) 12.5 MG tablet Take 12.5 mg by mouth every 6 (six) hours as needed.  :  Review of Systems:  Out of a complete 14 point review of systems, all are reviewed and negative with the exception of these symptoms as listed below:   Review of Systems  Constitutional: Positive for fatigue.  HENT:       Hearing loss  Respiratory: Positive for shortness of breath.        Snoring  Allergic/Immunologic:       Seasonal allergies  Neurological:       Sleepiness    Objective:  Neurologic Exam  Physical Exam Physical Examination:   Filed Vitals:   05/27/14 1459  BP: 138/84  Pulse: 101  Temp: 97 F (36.1 C)    General Examination: The patient is a very pleasant 43 y.o. male in no acute distress. He appears well-developed and well-nourished and well groomed. He is obese.   HEENT: Normocephalic, atraumatic, pupils are equal, round and reactive to light and accommodation. Funduscopic exam is normal with sharp disc margins noted. Extraocular tracking is good without limitation to gaze excursion or nystagmus noted. Normal smooth pursuit is noted. Hearing is grossly intact. Tympanic membranes are clear bilaterally with some  scarring noted. Face is symmetric with normal facial animation and normal facial sensation. Speech is clear with no dysarthria noted. There is no hypophonia. There is no lip, neck/head, jaw or voice tremor. Neck is supple with full range of passive and active motion. There are no carotid bruits on auscultation. Oropharynx exam reveals: mild mouth dryness, adequate dental hygiene and moderate airway crowding, due to narrow airway entry and redundant soft palate. Mallampati is class II. Tongue protrudes centrally and palate elevates symmetrically. Tonsils are absent. Neck size is 18.5 inches. He has a Mild overbite. Nasal inspection reveals no significant nasal mucosal bogginess or redness and no septal deviation.   Chest: Clear to auscultation without wheezing, rhonchi or crackles noted.  Heart: S1+S2+0, regular and normal without  murmurs, rubs or gallops noted.   Abdomen: Soft, non-tender and non-distended with normal bowel sounds appreciated on auscultation.  Extremities: There is no pitting edema in the distal lower extremities bilaterally. Pedal pulses are intact.  Skin: Warm and dry without trophic changes noted. There are no varicose veins.  Musculoskeletal: exam reveals no obvious joint deformities, tenderness or joint swelling or erythema.   Neurologically:  Mental status: The patient is awake, alert and oriented in all 4 spheres. His immediate and remote memory, attention, language skills and fund of knowledge are appropriate. There is no evidence of aphasia, agnosia, apraxia or anomia. Speech is clear with normal prosody and enunciation. Thought process is linear. Mood is normal and affect is normal.  Cranial nerves II - XII are as described above under HEENT exam. In addition: shoulder shrug is normal with equal shoulder height noted. Motor exam: Normal bulk, strength and tone is noted. There is no drift, tremor or rebound. Romberg is negative. Reflexes are 2+ throughout. Babinski: Toes  are flexor bilaterally. Fine motor skills and coordination: intact with normal finger taps, normal hand movements, normal rapid alternating patting, normal foot taps and normal foot agility.  Cerebellar testing: No dysmetria or intention tremor on finger to nose testing. Heel to shin is unremarkable bilaterally. There is no truncal or gait ataxia.  Sensory exam: intact to light touch, pinprick, vibration, temperature sense in the upper and lower extremities.  Gait, station and balance: He stands easily. No veering to one side is noted. No leaning to one side is noted. Posture is age-appropriate and stance is narrow based. Gait shows normal stride length and normal pace. No problems turning are noted. He turns en bloc. Tandem walk is unremarkable. Intact toe and heel stance is noted.               Assessment and Plan:   In summary, MCCRAE SPECIALE is a very pleasant 43 y.o.-year old male with an underlying medical history of hypertension, osteoarthritis, s/p 2 knee surgeries (1997 and 2013), hyperlipidemia, and morbid obesity, who reports snoring and witnessed apneas and severe EDS. In addition, he reports frequent morning headaches, nocturia, some restless leg symptoms and is known to twitching in his sleep. His history and physical exam are highly concerning for underlying obstructive sleep apnea and he gives a telltale history. He also may have periodic leg movements in sleep. The pre-test probability for OSA is very high in his case. I had a long chat with the patient and his wife about my findings and the diagnosis of OSA, its prognosis and treatment options. We talked about medical treatments, surgical interventions and non-pharmacological approaches. I explained in particular the risks and ramifications of untreated moderate to severe OSA, especially with respect to developing cardiovascular disease down the Road, including congestive heart failure, difficult to treat hypertension, cardiac arrhythmias,  or stroke. Even type 2 diabetes has, in part, been linked to untreated OSA. Symptoms of untreated OSA include daytime sleepiness, memory problems, mood irritability and mood disorder such as depression and anxiety, lack of energy, as well as recurrent headaches, especially morning headaches. We talked about trying to maintain a healthy lifestyle in general, as well as the importance of weight control. I encouraged the patient to eat healthy, exercise daily and keep well hydrated, to keep a scheduled bedtime and wake time routine, to not skip any meals and eat healthy snacks in between meals. I advised the patient not to drive when feeling sleepy. I recommended the  following at this time: sleep study with potential positive airway pressure titration. (We will score hypopneas at 3% and split the sleep study into diagnostic and treatment portion, if the estimated. 2 hour AHI is >15/h).   I explained the sleep test procedure to the patient and also outlined possible surgical and non-surgical treatment options of OSA, including the use of a custom-made dental device (which would require a referral to a specialist dentist or oral surgeon), upper airway surgical options, such as pillar implants, radiofrequency surgery, tongue base surgery, and UPPP (which would involve a referral to an ENT surgeon). Rarely, jaw surgery such as mandibular advancement may be considered.  I also explained the CPAP treatment option to the patient, who indicated that he would be willing to try CPAP if the need arises. I explained the importance of being compliant with PAP treatment, not only for insurance purposes but primarily to improve His symptoms, and for the patient's long term health benefit, including to reduce His cardiovascular risks. I answered all their questions today and the patient and his wife were in agreement. I would like to see him back after the sleep study is completed and encouraged them to call with any interim  questions, concerns, problems or updates.   Thank you very much for allowing me to participate in the care of this nice patient. If I can be of any further assistance to you please do not hesitate to call me at [phone].  Sincerely,   Star Age, MD, PhD

## 2014-06-06 ENCOUNTER — Ambulatory Visit (INDEPENDENT_AMBULATORY_CARE_PROVIDER_SITE_OTHER): Payer: BC Managed Care – PPO

## 2014-06-06 DIAGNOSIS — R519 Headache, unspecified: Secondary | ICD-10-CM

## 2014-06-06 DIAGNOSIS — R400 Somnolence: Secondary | ICD-10-CM

## 2014-06-06 DIAGNOSIS — R51 Headache: Secondary | ICD-10-CM

## 2014-06-06 DIAGNOSIS — G4710 Hypersomnia, unspecified: Secondary | ICD-10-CM

## 2014-06-06 DIAGNOSIS — G4761 Periodic limb movement disorder: Secondary | ICD-10-CM

## 2014-06-06 DIAGNOSIS — G2581 Restless legs syndrome: Secondary | ICD-10-CM

## 2014-06-06 DIAGNOSIS — R351 Nocturia: Secondary | ICD-10-CM

## 2014-06-06 DIAGNOSIS — G4733 Obstructive sleep apnea (adult) (pediatric): Secondary | ICD-10-CM

## 2014-06-13 ENCOUNTER — Encounter: Payer: Self-pay | Admitting: Neurology

## 2014-06-13 ENCOUNTER — Telehealth: Payer: Self-pay | Admitting: Neurology

## 2014-06-13 DIAGNOSIS — G4733 Obstructive sleep apnea (adult) (pediatric): Secondary | ICD-10-CM

## 2014-06-13 NOTE — Telephone Encounter (Signed)
Please call and notify patient that the recent sleep study confirmed the diagnosis of OSA. While he did Reasonably well on positive airway pressure, he did not have resolution of his sleep disordered breathing with CPAP and we did not have enough time for proper titration on BiPAP. BiPAP therapy does seem to be better than CPAP for him. Therefore, I would like for him to try BiPAP at home and I will order auto BiPAP.  Pls arrange for PAP set up at home through a DME company of patient's choice and fax/route report to PCP and referring MD (if other than PCP).   The patient will also need a follow up appointment with me in 6-8 weeks post set up that has to be scheduled; help the patient schedule this (in a follow-up slot).   Please re-enforce the importance of compliance with treatment and the need for [place] to monitor compliance data.   Once you have spoken to the patient and scheduled the return appointment, you may close this encounter, thanks,   Star Age, MD, PhD [facility] ([place])

## 2014-06-17 ENCOUNTER — Encounter: Payer: Self-pay | Admitting: *Deleted

## 2014-06-17 NOTE — Telephone Encounter (Signed)
Patient was contacted and informed of his overnight split study results.  Patient is in agreement with beginning BIPAP therapy.  Patient states that him and his family have recently retired and are moving to the [employer] area in [place].  Patient requested to contact our office back in regards to arranging a DME company to provide order and results to.  A copy was mailed to the patient and a copy was faxed to Dr. Redmond School.

## 2014-09-09 ENCOUNTER — Telehealth: Payer: Self-pay | Admitting: Neurology

## 2014-09-09 NOTE — Telephone Encounter (Signed)
Certificate of medical necessity was faxed several times but would not go thru, called [employer] and they said try again, tried and still did not go thru, mailed 09/10/14

## 2015-10-15 ENCOUNTER — Ambulatory Visit: Payer: Self-pay | Admitting: General Surgery

## 2015-10-16 MED ORDER — CIPROFLOXACIN IN D5W 400 MG/200ML IV SOLN: 400.0000 mg | INTRAVENOUS | Status: DC

## 2015-10-16 MED ORDER — CHLORHEXIDINE GLUCONATE 4 % EX LIQD: 1.0000 "application " | Freq: Once | CUTANEOUS | Status: DC

## 2015-10-19 ENCOUNTER — Encounter (HOSPITAL_COMMUNITY): Admission: RE | Payer: Self-pay | Source: Ambulatory Visit

## 2015-10-19 ENCOUNTER — Ambulatory Visit (HOSPITAL_COMMUNITY)
Admission: RE | Admit: 2015-10-19 | Payer: No Typology Code available for payment source | Source: Ambulatory Visit | Admitting: General Surgery

## 2015-10-19 SURGERY — REPAIR, HERNIA, UMBILICAL, LAPAROSCOPIC
Anesthesia: General

## 2016-09-20 DIAGNOSIS — F39 Unspecified mood [affective] disorder: Secondary | ICD-10-CM | POA: Diagnosis not present

## 2016-09-26 DIAGNOSIS — F39 Unspecified mood [affective] disorder: Secondary | ICD-10-CM | POA: Diagnosis not present

## 2016-10-04 DIAGNOSIS — F39 Unspecified mood [affective] disorder: Secondary | ICD-10-CM | POA: Diagnosis not present

## 2016-10-10 DIAGNOSIS — D485 Neoplasm of uncertain behavior of skin: Secondary | ICD-10-CM | POA: Diagnosis not present

## 2016-10-10 DIAGNOSIS — D235 Other benign neoplasm of skin of trunk: Secondary | ICD-10-CM | POA: Diagnosis not present

## 2016-10-10 DIAGNOSIS — D224 Melanocytic nevi of scalp and neck: Secondary | ICD-10-CM | POA: Diagnosis not present

## 2016-10-10 DIAGNOSIS — L570 Actinic keratosis: Secondary | ICD-10-CM | POA: Diagnosis not present

## 2016-10-10 DIAGNOSIS — L905 Scar conditions and fibrosis of skin: Secondary | ICD-10-CM | POA: Diagnosis not present

## 2016-10-10 DIAGNOSIS — D225 Melanocytic nevi of trunk: Secondary | ICD-10-CM | POA: Diagnosis not present

## 2016-10-18 DIAGNOSIS — F39 Unspecified mood [affective] disorder: Secondary | ICD-10-CM | POA: Diagnosis not present

## 2016-10-20 DIAGNOSIS — F39 Unspecified mood [affective] disorder: Secondary | ICD-10-CM | POA: Diagnosis not present

## 2016-11-01 DIAGNOSIS — F39 Unspecified mood [affective] disorder: Secondary | ICD-10-CM | POA: Diagnosis not present

## 2016-11-09 DIAGNOSIS — F39 Unspecified mood [affective] disorder: Secondary | ICD-10-CM | POA: Diagnosis not present

## 2016-11-21 DIAGNOSIS — F39 Unspecified mood [affective] disorder: Secondary | ICD-10-CM | POA: Diagnosis not present

## 2016-11-23 DIAGNOSIS — F39 Unspecified mood [affective] disorder: Secondary | ICD-10-CM | POA: Diagnosis not present

## 2016-12-02 DIAGNOSIS — Z79899 Other long term (current) drug therapy: Secondary | ICD-10-CM | POA: Diagnosis not present

## 2016-12-02 DIAGNOSIS — F439 Reaction to severe stress, unspecified: Secondary | ICD-10-CM | POA: Diagnosis not present

## 2016-12-02 DIAGNOSIS — R079 Chest pain, unspecified: Secondary | ICD-10-CM | POA: Diagnosis not present

## 2016-12-02 DIAGNOSIS — Z8249 Family history of ischemic heart disease and other diseases of the circulatory system: Secondary | ICD-10-CM | POA: Diagnosis not present

## 2016-12-02 DIAGNOSIS — I10 Essential (primary) hypertension: Secondary | ICD-10-CM | POA: Diagnosis not present

## 2016-12-02 DIAGNOSIS — R0789 Other chest pain: Secondary | ICD-10-CM | POA: Diagnosis not present

## 2016-12-07 DIAGNOSIS — F39 Unspecified mood [affective] disorder: Secondary | ICD-10-CM | POA: Diagnosis not present

## 2016-12-14 DIAGNOSIS — F39 Unspecified mood [affective] disorder: Secondary | ICD-10-CM | POA: Diagnosis not present

## 2016-12-23 DIAGNOSIS — F319 Bipolar disorder, unspecified: Secondary | ICD-10-CM | POA: Diagnosis not present

## 2016-12-28 DIAGNOSIS — F39 Unspecified mood [affective] disorder: Secondary | ICD-10-CM | POA: Diagnosis not present

## 2017-01-04 DIAGNOSIS — F39 Unspecified mood [affective] disorder: Secondary | ICD-10-CM | POA: Diagnosis not present

## 2017-01-14 DIAGNOSIS — J40 Bronchitis, not specified as acute or chronic: Secondary | ICD-10-CM | POA: Diagnosis not present

## 2017-01-14 DIAGNOSIS — R062 Wheezing: Secondary | ICD-10-CM | POA: Diagnosis not present

## 2017-01-23 DIAGNOSIS — H66002 Acute suppurative otitis media without spontaneous rupture of ear drum, left ear: Secondary | ICD-10-CM | POA: Diagnosis not present

## 2017-01-23 DIAGNOSIS — J4520 Mild intermittent asthma, uncomplicated: Secondary | ICD-10-CM | POA: Diagnosis not present

## 2017-01-24 DIAGNOSIS — F39 Unspecified mood [affective] disorder: Secondary | ICD-10-CM | POA: Diagnosis not present

## 2017-01-25 DIAGNOSIS — F39 Unspecified mood [affective] disorder: Secondary | ICD-10-CM | POA: Diagnosis not present

## 2017-02-22 DIAGNOSIS — F39 Unspecified mood [affective] disorder: Secondary | ICD-10-CM | POA: Diagnosis not present

## 2017-03-22 DIAGNOSIS — L0100 Impetigo, unspecified: Secondary | ICD-10-CM | POA: Diagnosis not present

## 2017-03-22 DIAGNOSIS — L309 Dermatitis, unspecified: Secondary | ICD-10-CM | POA: Diagnosis not present

## 2017-03-30 DIAGNOSIS — F39 Unspecified mood [affective] disorder: Secondary | ICD-10-CM | POA: Diagnosis not present

## 2017-04-17 DIAGNOSIS — D485 Neoplasm of uncertain behavior of skin: Secondary | ICD-10-CM | POA: Diagnosis not present

## 2017-04-17 DIAGNOSIS — L570 Actinic keratosis: Secondary | ICD-10-CM | POA: Diagnosis not present

## 2017-04-17 DIAGNOSIS — L309 Dermatitis, unspecified: Secondary | ICD-10-CM | POA: Diagnosis not present

## 2017-04-17 DIAGNOSIS — D225 Melanocytic nevi of trunk: Secondary | ICD-10-CM | POA: Diagnosis not present

## 2017-05-04 DIAGNOSIS — F39 Unspecified mood [affective] disorder: Secondary | ICD-10-CM | POA: Diagnosis not present

## 2017-06-14 DIAGNOSIS — F39 Unspecified mood [affective] disorder: Secondary | ICD-10-CM | POA: Diagnosis not present

## 2017-07-12 DIAGNOSIS — F39 Unspecified mood [affective] disorder: Secondary | ICD-10-CM | POA: Diagnosis not present

## 2017-07-17 DIAGNOSIS — D485 Neoplasm of uncertain behavior of skin: Secondary | ICD-10-CM | POA: Diagnosis not present

## 2017-07-19 DIAGNOSIS — I10 Essential (primary) hypertension: Secondary | ICD-10-CM | POA: Diagnosis not present

## 2017-07-19 DIAGNOSIS — F99 Mental disorder, not otherwise specified: Secondary | ICD-10-CM | POA: Diagnosis not present

## 2017-07-19 DIAGNOSIS — Z23 Encounter for immunization: Secondary | ICD-10-CM | POA: Diagnosis not present

## 2017-07-19 DIAGNOSIS — Z818 Family history of other mental and behavioral disorders: Secondary | ICD-10-CM | POA: Diagnosis not present

## 2017-07-19 DIAGNOSIS — E119 Type 2 diabetes mellitus without complications: Secondary | ICD-10-CM | POA: Diagnosis not present

## 2017-07-26 DIAGNOSIS — F39 Unspecified mood [affective] disorder: Secondary | ICD-10-CM | POA: Diagnosis not present

## 2017-07-31 ENCOUNTER — Telehealth: Payer: Self-pay | Admitting: Orthopedic Surgery

## 2017-07-31 NOTE — Telephone Encounter (Signed)
Patient called to ask if Dr Aline Brochure was in today to evaluate his knee (surgery done in 2013 by Dr Aline Brochure), due to a new injury/fall onto same knee).  Relayed our providers are out of office today; therefore, recommend emergency room for evaluation and work-up. Patient states will go on, and will let us know if needs appointment.

## 2017-08-02 ENCOUNTER — Ambulatory Visit (INDEPENDENT_AMBULATORY_CARE_PROVIDER_SITE_OTHER): Payer: BLUE CROSS/BLUE SHIELD

## 2017-08-02 ENCOUNTER — Ambulatory Visit: Payer: BLUE CROSS/BLUE SHIELD | Admitting: Orthopedic Surgery

## 2017-08-02 ENCOUNTER — Encounter: Payer: Self-pay | Admitting: Orthopedic Surgery

## 2017-08-02 VITALS — BP 150/95 | HR 101 | Ht 78.0 in | Wt 332.0 lb

## 2017-08-02 DIAGNOSIS — M25062 Hemarthrosis, left knee: Secondary | ICD-10-CM

## 2017-08-02 DIAGNOSIS — Z9889 Other specified postprocedural states: Secondary | ICD-10-CM

## 2017-08-02 DIAGNOSIS — M2202 Recurrent dislocation of patella, left knee: Secondary | ICD-10-CM

## 2017-08-02 DIAGNOSIS — S83242A Other tear of medial meniscus, current injury, left knee, initial encounter: Secondary | ICD-10-CM | POA: Diagnosis not present

## 2017-08-02 NOTE — Patient Instructions (Signed)
MRI scan has been ordered for you, we will get approval for the scan if needed, then call you with appointment. We will also schedule a follow up appointment for you when we call you. If you do not hear anything within one week, please contact our office.

## 2017-08-02 NOTE — Progress Notes (Signed)
Progress Note   Patient ID: Jose [name], male   DOB: [birth year], 46 y.o.   MRN: [id number]  Chief Complaint  Patient presents with  . New Patient (Initial Visit)    Left Knee Pain    46 year old male has had 2 knee arthroscopies one in 1996 and then repeat in 2013.  In 2013 he had microfracture surgery along with meniscus surgery.  On November 26 he slipped on spot of mod fell and injured his left knee.  He remembers it being flexed and behind him and he had to put his knee back in place.  He complains of severe left knee dull pain associated with swelling and decreased range of motion and no neurovascular complaints.  The pain is controlled with ibuprofen.   He noticed few hours after the knee injury that his right shoulder was hurting.  He complains of anterior shoulder pain near the coracoid and rotator interval but no loss of motion or weakness     Review of Systems  Musculoskeletal: Positive for joint pain. Negative for back pain.  Neurological: Negative for tingling, tremors, sensory change and focal weakness.   Current Meds  Medication Sig  . fexofenadine (ALLEGRA) 180 MG tablet Take 180 mg by mouth daily as needed for allergies.   . hydrochlorothiazide (,MICROZIDE/HYDRODIURIL,) 12.5 MG capsule Take 25 mg by mouth every morning.   Marland Kitchen losartan (COZAAR) 100 MG tablet Take 100 mg by mouth daily.     Past Medical History:  Diagnosis Date  . Diverticulitis   . GERD (gastroesophageal reflux disease)   . Hypertension   . Seasonal allergies      Allergies  Allergen Reactions  . Shellfish Allergy Anaphylaxis  . Penicillins     Unknown, childhood reaction Has patient had a PCN reaction causing immediate rash, facial/tongue/throat swelling, SOB or lightheadedness with hypotension: Unknown Has patient had a PCN reaction causing severe rash involving mucus membranes or skin necrosis: Unknown Has patient had a PCN reaction that required hospitalization No Has patient had a  PCN reaction occurring within the last 10 years: No If all of the above answers are "NO", then may proceed with Cephalosporin use.     BP (!) 150/95   Pulse (!) 101   Ht 6' 6"  (1.981 m)   Wt (!) 332 lb (150.6 kg)   BMI 38.37 kg/m    Physical Exam  Constitutional: He is oriented to person, place, and time. He appears well-developed and well-nourished.  Vital signs have been reviewed and are stable. Gen. appearance the patient is well-developed and well-nourished with normal grooming and hygiene.   Musculoskeletal:       Right shoulder: He exhibits tenderness and pain. He exhibits normal range of motion, no bony tenderness, no swelling, no effusion, no crepitus, no deformity, no laceration, no spasm, normal pulse and normal strength.       Left knee: He exhibits decreased range of motion, swelling, effusion, abnormal patellar mobility and abnormal meniscus. He exhibits no ecchymosis, no deformity, no laceration, no erythema, normal alignment and no LCL laxity. Tenderness found. Medial joint line tenderness noted. No patellar tendon tenderness noted.       Arms:      Legs: Antalgic gait left knee  Neurological: He is alert and oriented to person, place, and time.  Skin: Skin is warm and dry. No erythema.  Psychiatric: He has a normal mood and affect.  Vitals reviewed.   Left Knee Exam   Other  Effusion: effusion present  Medical decision-making  Imaging: R x-ray today I interpret as medial compartment and lateral compartment arthrosis with joint space narrowing more medial than lateral no acute fracture is seen patella height is normal  Differential diagnosis Encounter Diagnoses  Name Primary?  . S/P arthroscopy of left knee 03/16/12 Yes  . Hemarthrosis of left knee   . Acute medial meniscus tear of left knee, initial encounter   . Recurrent dislocation of left patella     Aspiration left knee verbal consent was given for aspiration of the left knee.  Timeout  confirmed site of aspiration  After sterile preparation and ethyl chloride administration to the skin and 18-gauge needle was used to approach the knee from a superolateral approach.  I aspirated 32 cc of blood  Recommend MRI to evaluate the hemarthrosis with a differential diagnosis which includes patellar dislocation, acute meniscal peripheral tear ligament injury including MCL and hip fracture although x-ray was negative  Patient is placed in an Ace wrap and placed in a hinged knee brace with a follow-up after MRI has been completed     Arther Abbott, MD 08/02/2017 12:14 PM

## 2017-08-03 ENCOUNTER — Telehealth: Payer: Self-pay | Admitting: Radiology

## 2017-08-03 NOTE — Telephone Encounter (Signed)
[id number] is the order ID for the approval of the scan , he can have at [facility] on Tuesday 4th 8pm, arrive at 7:45 for scan called patient to advise.

## 2017-08-08 ENCOUNTER — Ambulatory Visit (HOSPITAL_COMMUNITY)
Admission: RE | Admit: 2017-08-08 | Discharge: 2017-08-08 | Disposition: A | Discharge status: Home / Self Care | Payer: BLUE CROSS/BLUE SHIELD | Source: Ambulatory Visit | Attending: Orthopedic Surgery | Admitting: Orthopedic Surgery

## 2017-08-08 DIAGNOSIS — Z9889 Other specified postprocedural states: Secondary | ICD-10-CM | POA: Diagnosis not present

## 2017-08-08 DIAGNOSIS — M1712 Unilateral primary osteoarthritis, left knee: Secondary | ICD-10-CM | POA: Insufficient documentation

## 2017-08-08 DIAGNOSIS — M25062 Hemarthrosis, left knee: Secondary | ICD-10-CM | POA: Diagnosis not present

## 2017-08-08 DIAGNOSIS — M25562 Pain in left knee: Secondary | ICD-10-CM | POA: Diagnosis not present

## 2017-08-08 DIAGNOSIS — S83242A Other tear of medial meniscus, current injury, left knee, initial encounter: Secondary | ICD-10-CM

## 2017-08-08 DIAGNOSIS — M2202 Recurrent dislocation of patella, left knee: Secondary | ICD-10-CM

## 2017-08-11 ENCOUNTER — Ambulatory Visit: Payer: BLUE CROSS/BLUE SHIELD | Admitting: Orthopedic Surgery

## 2017-08-11 VITALS — BP 148/93 | HR 92 | Ht 78.0 in | Wt 330.0 lb

## 2017-08-11 DIAGNOSIS — M25062 Hemarthrosis, left knee: Secondary | ICD-10-CM | POA: Diagnosis not present

## 2017-08-11 NOTE — Progress Notes (Signed)
FOLLOW UP VISIT   Patient ID: Jose [name], male   DOB: 10-13-1970, 46 y.o.   MRN: [id number]  Chief Complaint  Patient presents with  . Follow-up    MRI follow-up; recheck on left knee    HPI Jose [name] is a 46 y.o. male.   HPI  46 year old male injured his knee had aspiration with hemarthrosis comes in after MRI saying his knee feels better  Review of Systems ROS     Physical Exam  No swelling full range of motion  MEDICAL DECISION MAKING  DATA   MENISCI   Medial meniscus: The patient appears to have had resection of the posterior horn and midbody. The anterior meniscal remnants is peripherally subluxed.   Lateral meniscus: The patient has had resection of the posterior horn and posterior aspect of the midbody. The anterior meniscal remnant is peripherally subluxed.   LIGAMENTS   Cruciates: No appreciable anterior cruciate ligament. Posterior cruciate ligament is intact.   Collaterals:  Intact.   CARTILAGE   Patellofemoral: Focal areas of partial-thickness cartilage loss of the trochlear groove of the distal femur. Patellar cartilage appears normal.   Medial: Small focal area of full-thickness cartilage loss in the central portion of the femoral condyle. Small focal area of partial-thickness cartilage loss of the posterior aspect of the tibial plateau.   Lateral: Multiple areas of full-thickness and partial-thickness cartilage loss in the lateral compartment.   Joint:  Minimal joint effusion.   Popliteal Fossa:  Tiny Baker's cyst.  Intact popliteus tendon.   Extensor Mechanism:  Normal.   Bones: Marginal osteophytes in the patellofemoral and lateral compartments.   Other: None   IMPRESSION: 1. Moderate osteoarthritic changes of the lateral compartment with mild arthritic changes of the patellofemoral and medial compartments. 2. Evidence consistent with previous partial meniscectomies bilaterally. 3. Probable chronic complete tear  of the anterior cruciate ligament.     Electronically Signed   By: Lorriane Shire M.D.   On: 08/08/2017 09:19   Encounter Diagnosis  Name Primary?  . Hemarthrosis of left knee Yes   He is doing well he should continue activity modification wear the brace for activity.  He has 3 compartment disease now will need knee replacement at the time that is warranted but not right now.  We discussed this at length he will follow-up as needed

## 2017-08-23 DIAGNOSIS — Z0000 Encounter for general adult medical examination without abnormal findings: Secondary | ICD-10-CM | POA: Diagnosis not present

## 2017-08-23 DIAGNOSIS — Z23 Encounter for immunization: Secondary | ICD-10-CM | POA: Diagnosis not present

## 2017-08-23 DIAGNOSIS — Z1389 Encounter for screening for other disorder: Secondary | ICD-10-CM | POA: Diagnosis not present

## 2018-02-16 DIAGNOSIS — G4733 Obstructive sleep apnea (adult) (pediatric): Secondary | ICD-10-CM | POA: Diagnosis not present

## 2018-02-16 DIAGNOSIS — E119 Type 2 diabetes mellitus without complications: Secondary | ICD-10-CM | POA: Diagnosis not present

## 2018-02-16 DIAGNOSIS — I10 Essential (primary) hypertension: Secondary | ICD-10-CM | POA: Diagnosis not present

## 2018-02-21 DIAGNOSIS — R7303 Prediabetes: Secondary | ICD-10-CM | POA: Diagnosis not present

## 2018-02-21 DIAGNOSIS — F99 Mental disorder, not otherwise specified: Secondary | ICD-10-CM | POA: Diagnosis not present

## 2018-02-21 DIAGNOSIS — I10 Essential (primary) hypertension: Secondary | ICD-10-CM | POA: Diagnosis not present

## 2018-05-01 DIAGNOSIS — R197 Diarrhea, unspecified: Secondary | ICD-10-CM | POA: Diagnosis not present

## 2018-05-01 DIAGNOSIS — Z6837 Body mass index (BMI) 37.0-37.9, adult: Secondary | ICD-10-CM | POA: Diagnosis not present

## 2018-05-01 DIAGNOSIS — Z842 Family history of other diseases of the genitourinary system: Secondary | ICD-10-CM | POA: Diagnosis not present

## 2018-05-01 DIAGNOSIS — N410 Acute prostatitis: Secondary | ICD-10-CM | POA: Diagnosis not present

## 2018-06-06 ENCOUNTER — Ambulatory Visit (INDEPENDENT_AMBULATORY_CARE_PROVIDER_SITE_OTHER): Payer: Self-pay | Admitting: Psychiatry

## 2018-06-06 DIAGNOSIS — F3181 Bipolar II disorder: Secondary | ICD-10-CM
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
## 2018-06-10 DIAGNOSIS — F422 Mixed obsessional thoughts and acts: Secondary | ICD-10-CM

## 2018-06-10 DIAGNOSIS — F429 Obsessive-compulsive disorder, unspecified: Secondary | ICD-10-CM | POA: Insufficient documentation

## 2018-06-10 DIAGNOSIS — F3160 Bipolar disorder, current episode mixed, unspecified: Secondary | ICD-10-CM

## 2018-06-20 ENCOUNTER — Ambulatory Visit: Payer: Self-pay | Admitting: Psychiatry

## 2018-06-21 ENCOUNTER — Ambulatory Visit: Payer: Self-pay | Admitting: Psychiatry

## 2018-06-21 DIAGNOSIS — F4310 Post-traumatic stress disorder, unspecified: Secondary | ICD-10-CM

## 2018-06-21 DIAGNOSIS — F3160 Bipolar disorder, current episode mixed, unspecified: Secondary | ICD-10-CM

## 2018-06-21 DIAGNOSIS — F422 Mixed obsessional thoughts and acts: Secondary | ICD-10-CM

## 2018-06-21 MED ORDER — LAMOTRIGINE 100 MG PO TABS: 200.0000 mg | ORAL_TABLET | Freq: Every day | ORAL | 1 refills | Status: DC

## 2018-06-21 MED ORDER — MIRTAZAPINE 15 MG PO TABS: 15.0000 mg | ORAL_TABLET | Freq: Every day | ORAL | 1 refills | Status: DC

## 2018-06-21 MED ORDER — OXCARBAZEPINE 300 MG PO TABS: 300.0000 mg | ORAL_TABLET | Freq: Two times a day (BID) | ORAL | 1 refills | Status: DC

## 2018-06-21 NOTE — Progress Notes (Addendum)
[facility] Med Check  Patient ID: Jose [name],  MRN: [id number]  PCP: Redmond School, MD  Date of Evaluation: 06/21/2018 Time spent:20 minutes   HISTORY/CURRENT STATUS: HPI patient is a 47 year old white male last seen 03/29/2018.  At that time he was doing better his anger was better.  Medications were the same Patient has not done as well since his last visit.  He has noticed more anger and in the anger he is having perfectionism grandiosity but no suicidal thoughts overall the depression is not bad . But again he notices increased anxiety and increased irritability.   Individual Medical History/ Review of Systems: Changes? :No  Allergies: Shellfish allergy and Penicillins  Current Medications:  Current Outpatient Medications:  .  carbamazepine (CARBATROL) 300 MG 12 hr capsule, Take by mouth., Disp: , Rfl:  .  Carbamazepine (EQUETRO) 100 MG CP12 12 hr capsule, Take by mouth., Disp: , Rfl:  .  doxycycline (VIBRA-TABS) 100 MG tablet, Take 100 mg by mouth 2 (two) times daily. 7 day regimen started 10/13/15, Disp: , Rfl: 0 .  esomeprazole (NEXIUM) 20 MG capsule, Take 20 mg by mouth daily. , Disp: , Rfl:  .  fexofenadine (ALLEGRA) 180 MG tablet, Take 180 mg by mouth daily as needed for allergies. , Disp: , Rfl:  .  HYDROCHLOROTHIAZIDE PO, Take by mouth., Disp: , Rfl:  .  lamoTRIgine (LAMICTAL) 100 MG tablet, Take by mouth., Disp: , Rfl:  .  losartan (COZAAR) 100 MG tablet, Take 100 mg by mouth daily. , Disp: , Rfl:  .  metFORMIN (GLUCOPHAGE-XR) 500 MG 24 hr tablet, Take 500 mg by mouth at bedtime., Disp: , Rfl: 2 Medication Side Effects: None  Family Medical/ Social History: Changes? No  MENTAL HEALTH EXAM:  There were no vitals taken for this visit.There is no height or weight on file to calculate BMI.  General Appearance: Casual  Eye Contact:  Good  Speech:  Normal Rate  Volume:  Normal  Mood:  Angry  Affect:  Appropriate  Thought Process:  Linear  Orientation:   Full (Time, Place, and Person)  Thought Content: Logical   Suicidal Thoughts:  No  Homicidal Thoughts:  No  Memory:  memory  Judgement:  Good  Insight:  Good  Psychomotor Activity:  Normal  Concentration:  Concentration: Good  Recall:  Good  Fund of Knowledge: Good  Language: Good  Akathisia:  NA  AIMS (if indicated): na  Assets:  Desire for Improvement  ADL's:  Intact  Cognition: WNL  Prognosis:  Good    DIAGNOSES:    ICD-10-CM   1. Mixed bipolar I disorder (H[facility]) F31.60     RECOMMENDATIONS: pt to decrease carbamazepine er by 300 mg/day Start trileptal at 365m bid Rest meds same  Recheck 2 weeks Continue with FJosph MachoMay for counseling   CComer Locket PA-C

## 2018-06-26 ENCOUNTER — Ambulatory Visit: Payer: BLUE CROSS/BLUE SHIELD | Admitting: Urology

## 2018-06-26 DIAGNOSIS — R3912 Poor urinary stream: Secondary | ICD-10-CM | POA: Diagnosis not present

## 2018-06-26 DIAGNOSIS — N401 Enlarged prostate with lower urinary tract symptoms: Secondary | ICD-10-CM

## 2018-07-10 ENCOUNTER — Ambulatory Visit: Payer: Self-pay | Admitting: Psychiatry

## 2018-07-10 DIAGNOSIS — F319 Bipolar disorder, unspecified: Secondary | ICD-10-CM

## 2018-07-10 DIAGNOSIS — F4310 Post-traumatic stress disorder, unspecified: Secondary | ICD-10-CM

## 2018-07-10 DIAGNOSIS — F422 Mixed obsessional thoughts and acts: Secondary | ICD-10-CM

## 2018-07-10 DIAGNOSIS — R454 Irritability and anger: Secondary | ICD-10-CM

## 2018-07-10 MED ORDER — OXCARBAZEPINE 300 MG PO TABS: 300.0000 mg | ORAL_TABLET | Freq: Two times a day (BID) | ORAL | 1 refills | Status: DC

## 2018-07-10 NOTE — Progress Notes (Signed)
[facility] Med Check  Patient ID: Jose [name],  MRN: [id number]  PCP: Redmond School, MD  Date of Evaluation: 07/10/2018 Time spent:20 minutes  Chief Complaint:   HISTORY/CURRENT STATUS: HPI patient was last seen 06/21/2018.  Anxiety and irritability were worse.  Started him on Trileptal 300 mg twice daily.  He was decreased to [employer].  Is also on Lamictal and remeron. Currently doing well. Anger better. More rational.  Has gone back to work part time.    Individual Medical History/ Review of Systems: Changes? :Yes   Rash on legs several weeks, started after started trileptal.. NO sob or problems swallowing.  Brief off balance 1/day for 3 weeks. Last a few seconds. Not dizzy. Orthostatic.   Allergies: Shellfish allergy and Penicillins  Current Medications:  Current Outpatient Medications:  .  lamoTRIgine (LAMICTAL) 100 MG tablet, Take 2 tablets (200 mg total) by mouth daily., Disp: 60 tablet, Rfl: 1 .  mirtazapine (REMERON) 15 MG tablet, Take 1 tablet (15 mg total) by mouth at bedtime., Disp: 30 tablet, Rfl: 1 .  Oxcarbazepine (TRILEPTAL) 300 MG tablet, Take 1 tablet (300 mg total) by mouth 2 (two) times daily., Disp: 60 tablet, Rfl: 1 .  Carbamazepine (EQUETRO) 100 MG CP12 12 hr capsule, Take 300 mg by mouth. Decrease by 389m/day each week, Disp: , Rfl:  .  doxycycline (VIBRA-TABS) 100 MG tablet, Take 100 mg by mouth 2 (two) times daily. 7 day regimen started 10/13/15, Disp: , Rfl: 0 .  esomeprazole (NEXIUM) 20 MG capsule, Take 20 mg by mouth daily. , Disp: , Rfl:  .  fexofenadine (ALLEGRA) 180 MG tablet, Take 180 mg by mouth daily as needed for allergies. , Disp: , Rfl:  .  HYDROCHLOROTHIAZIDE PO, Take by mouth., Disp: , Rfl:  .  losartan (COZAAR) 100 MG tablet, Take 100 mg by mouth daily. , Disp: , Rfl:  .  metFORMIN (GLUCOPHAGE-XR) 500 MG 24 hr tablet, Take 500 mg by mouth at bedtime., Disp: , Rfl: 2 Medication Side Effects: none  Family Medical/ Social History:  Changes? No  MENTAL HEALTH EXAM:  There were no vitals taken for this visit.There is no height or weight on file to calculate BMI. bp 147/87 with pulse 75sitting. Standing 162/90 with pulse of 95  General Appearance: Obese  Eye Contact:  Good  Speech:  Normal Rate  Volume:  Normal  Mood:  Euthymic  Affect:  Appropriate  Thought Process:  Linear  Orientation:  Full (Time, Place, and Person)  Thought Content: Logical   Suicidal Thoughts:  No  Homicidal Thoughts:  No  Memory:  WNL  Judgement:  Good  Insight:  Good  Psychomotor Activity:  Normal  Concentration:  Concentration: Good  Recall:  Good  Fund of Knowledge: Good  Language: Good  Assets:  Desire for Improvement  ADL's:  Intact  Cognition: WNL  Prognosis:  Good    DIAGNOSES:    ICD-10-CM   1. B[place] I disorder [place] F31.9   2. Mixed obsessional thoughts and acts F42.2   3. PTSD (post-traumatic stress disorder) F43.10   4. Anger R45.4     Receiving Psychotherapy: Yes  fred ,may   RECOMMENDATIONS: continue trileptal 300 bid, lamictal 200/day. Tapering off equetro. Still at 2 pills/cyClay Shakeira Rhee, PA-C     2 pills/day.  Check bp at home today. Call pcp with blood pressure readings. Recheck 1 month

## 2018-07-11 ENCOUNTER — Ambulatory Visit: Payer: Self-pay | Admitting: Psychiatry

## 2018-07-11 DIAGNOSIS — F319 Bipolar disorder, unspecified: Secondary | ICD-10-CM
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
## 2018-07-13 ENCOUNTER — Other Ambulatory Visit: Payer: Self-pay

## 2018-07-13 MED ORDER — LAMOTRIGINE 100 MG PO TABS: 200.0000 mg | ORAL_TABLET | Freq: Every day | ORAL | 0 refills | Status: DC

## 2018-07-18 ENCOUNTER — Other Ambulatory Visit: Payer: Self-pay

## 2018-07-18 MED ORDER — OXCARBAZEPINE 300 MG PO TABS: 300.0000 mg | ORAL_TABLET | Freq: Two times a day (BID) | ORAL | 0 refills | Status: DC

## 2018-07-23 DIAGNOSIS — Z6838 Body mass index (BMI) 38.0-38.9, adult: Secondary | ICD-10-CM | POA: Diagnosis not present

## 2018-07-23 DIAGNOSIS — J209 Acute bronchitis, unspecified: Secondary | ICD-10-CM | POA: Diagnosis not present

## 2018-08-07 ENCOUNTER — Ambulatory Visit: Payer: BLUE CROSS/BLUE SHIELD | Admitting: Urology

## 2018-08-07 DIAGNOSIS — R3912 Poor urinary stream: Secondary | ICD-10-CM | POA: Diagnosis not present

## 2018-08-07 DIAGNOSIS — N401 Enlarged prostate with lower urinary tract symptoms: Secondary | ICD-10-CM | POA: Diagnosis not present

## 2018-08-15 ENCOUNTER — Ambulatory Visit: Payer: Self-pay | Admitting: Psychiatry

## 2018-08-15 ENCOUNTER — Ambulatory Visit: Payer: BLUE CROSS/BLUE SHIELD | Admitting: Psychiatry

## 2018-08-15 ENCOUNTER — Encounter: Payer: Self-pay | Admitting: Psychiatry

## 2018-08-15 DIAGNOSIS — F4310 Post-traumatic stress disorder, unspecified: Secondary | ICD-10-CM | POA: Diagnosis not present

## 2018-08-15 DIAGNOSIS — F3170 Bipolar disorder, currently in remission, most recent episode unspecified: Secondary | ICD-10-CM

## 2018-08-15 DIAGNOSIS — F319 Bipolar disorder, unspecified: Secondary | ICD-10-CM | POA: Diagnosis not present

## 2018-08-15 NOTE — Progress Notes (Signed)
[facility] Med Check  Patient ID: Jose [name],  MRN: [id number]  PCP: Redmond School, MD  Date of Evaluation: 08/15/2018 Time spent:20 minutes  Chief Complaint:   HISTORY/CURRENT STATUS: HPI patient last seen 07/10/2018 and he was doing well.  He was continued on his regular meds. Patient continues to do well.  He is on Trileptal 300 mg twice daily, Lamictal 200 mg a day, Remeron 15 mg one half at bedtime.  His diagnoses are bipolar disorder and PTSD.  Individual Medical History/ Review of Systems: Changes? :No   Allergies: Shellfish allergy and Penicillins  Current Medications:  Current Outpatient Medications:  .  lamoTRIgine (LAMICTAL) 100 MG tablet, Take 2 tablets (200 mg total) by mouth daily., Disp: 180 tablet, Rfl: 0 .  mirtazapine (REMERON) 15 MG tablet, Take 1 tablet (15 mg total) by mouth at bedtime., Disp: 30 tablet, Rfl: 1 .  Oxcarbazepine (TRILEPTAL) 300 MG tablet, Take 1 tablet (300 mg total) by mouth 2 (two) times daily., Disp: 180 tablet, Rfl: 0 .  doxycycline (VIBRA-TABS) 100 MG tablet, Take 100 mg by mouth 2 (two) times daily. 7 day regimen started 10/13/15, Disp: , Rfl: 0 .  esomeprazole (NEXIUM) 20 MG capsule, Take 20 mg by mouth daily. , Disp: , Rfl:  .  fexofenadine (ALLEGRA) 180 MG tablet, Take 180 mg by mouth daily as needed for allergies. , Disp: , Rfl:  .  HYDROCHLOROTHIAZIDE PO, Take by mouth., Disp: , Rfl:  .  losartan (COZAAR) 100 MG tablet, Take 100 mg by mouth daily. , Disp: , Rfl:  .  metFORMIN (GLUCOPHAGE-XR) 500 MG 24 hr tablet, Take 500 mg by mouth at bedtime., Disp: , Rfl: 2 Medication Side Effects: none  Family Medical/ Social History: Changes? No  MENTAL HEALTH EXAM:  There were no vitals taken for this visit.There is no height or weight on file to calculate BMI.  General Appearance: Casual  Eye Contact:  Good  Speech:  Clear and Coherent  Volume:  Normal  Mood:  Euthymic  Affect:  Appropriate  Thought Process:  Linear   Orientation:  Full (Time, Place, and Person)  Thought Content: Logical   Suicidal Thoughts:  No  Homicidal Thoughts:  No  Memory:  WNL  Judgement:  Good  Insight:  Good  Psychomotor Activity:  Normal  Concentration:  Concentration: Good  Recall:  Good  Fund of Knowledge: Good  Language: Good  Assets:  Desire for Improvement  ADL's:  Intact  Cognition: WNL  Prognosis:  Good    DIAGNOSES:    ICD-10-CM   1. Bipolar disorder in full remission, most recent episode unspecified type ([place]) F31.70   2. PTSD (post-traumatic stress disorder) F43.10     Receiving Psychotherapy: Yes  fred may   RECOMMENDATIONS: We will continue the same plan.  He is to return in 6 weeks.   [name], PA-C
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
## 2018-08-21 DIAGNOSIS — E782 Mixed hyperlipidemia: Secondary | ICD-10-CM | POA: Diagnosis not present

## 2018-08-21 DIAGNOSIS — R7303 Prediabetes: Secondary | ICD-10-CM | POA: Diagnosis not present

## 2018-08-21 DIAGNOSIS — I10 Essential (primary) hypertension: Secondary | ICD-10-CM | POA: Diagnosis not present

## 2018-08-21 DIAGNOSIS — E119 Type 2 diabetes mellitus without complications: Secondary | ICD-10-CM | POA: Diagnosis not present

## 2018-08-21 DIAGNOSIS — Z818 Family history of other mental and behavioral disorders: Secondary | ICD-10-CM | POA: Diagnosis not present

## 2018-08-27 DIAGNOSIS — Z0000 Encounter for general adult medical examination without abnormal findings: Secondary | ICD-10-CM | POA: Diagnosis not present

## 2018-08-27 DIAGNOSIS — Z23 Encounter for immunization: Secondary | ICD-10-CM | POA: Diagnosis not present

## 2018-08-27 DIAGNOSIS — Z6838 Body mass index (BMI) 38.0-38.9, adult: Secondary | ICD-10-CM | POA: Diagnosis not present

## 2018-09-19 ENCOUNTER — Encounter: Payer: Self-pay | Admitting: Psychiatry

## 2018-09-19 ENCOUNTER — Ambulatory Visit: Payer: BLUE CROSS/BLUE SHIELD | Admitting: Psychiatry

## 2018-09-19 DIAGNOSIS — F3160 Bipolar disorder, current episode mixed, unspecified: Secondary | ICD-10-CM

## 2018-09-19 DIAGNOSIS — F3170 Bipolar disorder, currently in remission, most recent episode unspecified: Secondary | ICD-10-CM

## 2018-09-19 DIAGNOSIS — F4310 Post-traumatic stress disorder, unspecified: Secondary | ICD-10-CM

## 2018-09-19 MED ORDER — MIRTAZAPINE 15 MG PO TABS: 15.0000 mg | ORAL_TABLET | Freq: Every day | ORAL | 1 refills | Status: DC

## 2018-09-19 MED ORDER — OXCARBAZEPINE 300 MG PO TABS: 300.0000 mg | ORAL_TABLET | Freq: Two times a day (BID) | ORAL | 0 refills | Status: DC

## 2018-09-19 MED ORDER — LAMOTRIGINE 100 MG PO TABS: 200.0000 mg | ORAL_TABLET | Freq: Every day | ORAL | 1 refills | Status: DC

## 2018-09-19 NOTE — Progress Notes (Signed)
[facility] Check  Patient ID: Jose [name],  MRN: [id number]  PCP: Redmond School, MD  Date of Evaluation: 09/19/2018 Time spent:20 minutes  Chief Complaint:   HISTORY/CURRENT STATUS: HPI patient seen 1 month ago and was doing well. Patient continues to do well.  Holidays are stressful for him as he remembered sad times growing up.  Individual Medical History/ Review of Systems: Changes? :No   Allergies: Shellfish allergy and Penicillins  Current Medications:  Current Outpatient Medications:  .  doxycycline (VIBRA-TABS) 100 MG tablet, Take 100 mg by mouth 2 (two) times daily. 7 day regimen started 10/13/15, Disp: , Rfl: 0 .  esomeprazole (NEXIUM) 20 MG capsule, Take 20 mg by mouth daily. , Disp: , Rfl:  .  fexofenadine (ALLEGRA) 180 MG tablet, Take 180 mg by mouth daily as needed for allergies. , Disp: , Rfl:  .  HYDROCHLOROTHIAZIDE PO, Take by mouth., Disp: , Rfl:  .  lamoTRIgine (LAMICTAL) 100 MG tablet, Take 2 tablets (200 mg total) by mouth daily., Disp: 180 tablet, Rfl: 1 .  losartan (COZAAR) 100 MG tablet, Take 100 mg by mouth daily. , Disp: , Rfl:  .  metFORMIN (GLUCOPHAGE-XR) 500 MG 24 hr tablet, Take 500 mg by mouth at bedtime., Disp: , Rfl: 2 .  mirtazapine (REMERON) 15 MG tablet, Take 1 tablet (15 mg total) by mouth at bedtime., Disp: 90 tablet, Rfl: 1 .  Oxcarbazepine (TRILEPTAL) 300 MG tablet, Take 1 tablet (300 mg total) by mouth 2 (two) times daily., Disp: 180 tablet, Rfl: 0 Medication Side Effects: none  Family Medical/ Social History: Changes? no  MENTAL HEALTH EXAM:  There were no vitals taken for this visit.There is no height or weight on file to calculate BMI.  General Appearance: Casual  Eye Contact:  Good  Speech:  Normal Rate  Volume:  Normal  Mood:  Euthymic  Affect:  Appropriate  Thought Process:  Linear  Orientation:  Full (Time, Place, and Person)  Thought Content: Logical   Suicidal Thoughts:  No  Homicidal Thoughts:  No  Memory:   WNL  Judgement:  Good  Insight:  Good  Psychomotor Activity:  Normal  Concentration:  Concentration: Good  Recall:  Good  Fund of Knowledge: Good  Language: Good  Assets:  Social Support  ADL's:  Intact  Cognition: WNL  Prognosis:  Good    DIAGNOSES:    ICD-10-CM   1. Mixed bipolar I disorder (HCC) F31.60   2. PTSD (post-traumatic stress disorder) F43.10     Receiving Psychotherapy: Yes  Fred May   RECOMMENDATIONS: Patient to continue same medications which include Trileptal 300 twice daily, Lamictal 200 a day, Remeron 15 mg one half at bedtime.  Patient will continue to see Kaylyn Layer in counseling.  I will see the patient back in 3 months   [name], PA-C

## 2018-09-19 NOTE — Progress Notes (Signed)
      [facility] Counselor/Therapist Progress Note  Patient ID: Jose [name], MRN: [id number],    Date: 09/19/2018   Time Spent: 48 minutes   Treatment Type: Individual Therapy  Reported Symptoms: Anxious Mood  Mental Status Exam:  Appearance:   Casual and Well Groomed     Behavior:  Appropriate  Motor:  Normal  Speech/Language:   Clear and Coherent  Affect:  Appropriate  Mood:  anxious  Thought process:  normal  Thought content:    WNL  Sensory/Perceptual disturbances:    WNL  Orientation:  oriented to person, place, time/date and situation  Attention:  Good  Concentration:  Good  Memory:  WNL  Fund of knowledge:   Good  Insight:    Good  Judgment:   Good  Impulse Control:  Good   Risk Assessment: Danger to Self:  No Self-injurious Behavior: No Danger to Others: No Duty to Warn:no Physical Aggression / Violence:No  Access to Firearms a concern: No  Gang Involvement:No   Subjective: The client stated that Christmas was a bad time for him.  "It is all about money."  He thinks it is because he was poor as a child so he feels he has to give everyone everything they want.  He states, "I am giver."  The client does feel he has to be so many things to everyone.  Today we focused on the pain the client feels from his childhood.  His pastor at his church wants him to get his testimony and lead a devotion.  "I am not ready to do this."  Client states he has a lot on his plate.  Today we used EMDR to help the client begin to process through the pain he feels from his childhood.  His negative cognition is "I have completely failed."As the client processed he stated a lot of emotion came up but he pulled it back down.  This happened 3 different times during the course of the session.  I explained to the client that the motion was good to come up 1 where the other and it might as well come up or we could control it.  I used the EMDR hand paddles with the client to use a  visualization where he interacted with Jesus.  I was hopeful this might be an easier way for him to give up the emotion.  He still had difficulty.  The client was much calmer at the end of the session.  We will continue at next session.  Interventions: Solution-Oriented/Positive Psychology, Eye Movement Desensitization and Reprocessing (EMDR) and Insight-Oriented  Diagnosis:   ICD-10-CM   1. Bipolar disorder in full remission, most recent episode unspecified type ([place]) F31.70     Plan: Self care, positive self talk.  Albertina Parr Daylan [place], [place]

## 2018-09-21 ENCOUNTER — Encounter: Payer: Self-pay | Admitting: Orthopedic Surgery

## 2018-09-21 ENCOUNTER — Ambulatory Visit: Payer: 59 | Admitting: Orthopedic Surgery

## 2018-09-21 VITALS — BP 137/91 | HR 73 | Ht 77.0 in | Wt 320.0 lb

## 2018-09-21 DIAGNOSIS — M25462 Effusion, left knee: Secondary | ICD-10-CM

## 2018-09-21 DIAGNOSIS — Z9889 Other specified postprocedural states: Secondary | ICD-10-CM

## 2018-09-21 NOTE — Progress Notes (Signed)
Chief Complaint  Patient presents with  . Knee Pain    left      48 year old male presents with a history of 2 knee scopes on his left knee the most recent one in 2013 he also had a patellar dislocation and hemarthrosis last year he has osteoarthritis on x-ray.  He took off running over the holidays and the next day his knee was swollen like a balloon.  He did apply ice for several days and modified his activity and the swelling has gone down but he wanted me to look at the knee to see if anything else need to be done  He is lost a lot of weight secondary to increase in exercise  Review of Systems  Constitutional: Negative for fever.  Skin: Negative.     Past Medical History:  Diagnosis Date  . Diverticulitis   . GERD (gastroesophageal reflux disease)   . Hypertension   . Seasonal allergies     BP (!) 137/91   Pulse 73   Ht 6' 5"  (1.956 m)   Wt (!) 320 lb (145.2 kg)   BMI 37.95 kg/m   Physical Exam Vitals signs reviewed.  Constitutional:      Appearance: Normal appearance. He is well-developed.  Musculoskeletal:       Legs:  Skin:    General: Skin is warm and dry.     Findings: No erythema.  Neurological:     Mental Status: He is alert and oriented to person, place, and time.     Gait: Gait normal.  Psychiatric:        Mood and Affect: Mood and affect normal.      Plan   At this point his knee is fine I recommend use over-the-counter medicines as needed continue to modify his exercise and activity follow-up if he becomes symptomatic  Encounter Diagnoses  Name Primary?  . S/P arthroscopy of left knee 03/16/12 Yes  . Swelling of joint, knee, left

## 2018-11-14 ENCOUNTER — Ambulatory Visit: Payer: Self-pay | Admitting: Psychiatry

## 2018-11-14 ENCOUNTER — Encounter: Payer: Self-pay | Admitting: Psychiatry

## 2018-11-14 ENCOUNTER — Other Ambulatory Visit: Payer: Self-pay

## 2018-11-14 DIAGNOSIS — F320 Major depressive disorder, single episode, mild: Secondary | ICD-10-CM
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
## 2018-12-19 ENCOUNTER — Encounter: Payer: Self-pay | Admitting: Psychiatry

## 2018-12-19 ENCOUNTER — Other Ambulatory Visit: Payer: Self-pay

## 2018-12-19 ENCOUNTER — Ambulatory Visit (INDEPENDENT_AMBULATORY_CARE_PROVIDER_SITE_OTHER): Payer: Self-pay | Admitting: Psychiatry

## 2018-12-19 ENCOUNTER — Ambulatory Visit: Payer: BLUE CROSS/BLUE SHIELD | Admitting: Psychiatry

## 2018-12-19 DIAGNOSIS — F3160 Bipolar disorder, current episode mixed, unspecified: Secondary | ICD-10-CM

## 2018-12-19 NOTE — Progress Notes (Signed)
      [facility] Counselor/Therapist Progress Note  Patient ID: Jose [name], MRN: [id number],    Date: 12/19/2018  Time Spent: 51 minutes   Treatment Type: Individual Therapy  Reported Symptoms: anxiety, stress  Mental Status Exam:  Appearance:   Casual and Well Groomed     Behavior:  Appropriate  Motor:  Normal  Speech/Language:   Clear and Coherent  Affect:  Appropriate  Mood:  anxious and stress  Thought process:  normal  Thought content:    WNL  Sensory/Perceptual disturbances:    WNL  Orientation:  oriented to person, place, time/date and situation  Attention:  Good  Concentration:  Good  Memory:  WNL  Fund of knowledge:   Good  Insight:    Good  Judgment:   Good  Impulse Control:  Good   Risk Assessment: Danger to Self:  No Self-injurious Behavior: No Danger to Others: No Duty to Warn:no Physical Aggression / Violence:No  Access to Firearms a concern: No  Gang Involvement:No   Subjective: I connected with patient by a video enabled telemedicine application or telephone, with their informed consent, and verified patient privacy and that I am speaking with the correct person using two identifiers.  I was located at [facility] psychiatric group and patient at home. "I have stayed home and not gone out."  The client is a self described germaphobe.  He states he has been cleaning his house obsessively and working outside to stay busy.  Since our last session he reports no altercations with his wife.  He has been losing weight but not doing the [employer].  He has been using portion control and lower carbs.  He is exercising and drinking water.  He is taking good omega-3's, zinc and a high-quality vitamin C.  The client also has been keeping a mood chart which he forwarded to my email today.  The client will continue the work that he is doing.  He is scheduled to see [name] on April 23. The client had been seen [name], [place].  I informed the client that  [name] had passed away this previous Nov 13, 2022 unexpectedly.  The client was shocked but understood.  He is concerned that [name] does not have a firsthand knowledge of him.  I told him that I would fill [name] in before his appointment.  The client was good with this.   Interventions: Solution-Oriented/Positive Psychology and Insight-Oriented  Diagnosis:   ICD-10-CM   1. Mixed bipolar I disorder ([name]) F31.60     Plan: Continue with diet, exercise, supplements.  Maintain mood log until his appointment with the psychiatrist.  This record has been created using [employer].  Chart creation errors have been sought, but Jose [name] not always have been located and corrected. Such creation errors do not reflect on the standard of medical care.   Author [place], [facility]

## 2018-12-27 ENCOUNTER — Other Ambulatory Visit: Payer: Self-pay

## 2018-12-27 ENCOUNTER — Encounter: Payer: Self-pay | Admitting: Psychiatry

## 2018-12-27 ENCOUNTER — Ambulatory Visit: Payer: Self-pay | Admitting: Psychiatry

## 2018-12-27 DIAGNOSIS — F411 Generalized anxiety disorder: Secondary | ICD-10-CM

## 2018-12-27 DIAGNOSIS — R454 Irritability and anger: Secondary | ICD-10-CM

## 2018-12-27 DIAGNOSIS — Z8659 Personal history of other mental and behavioral disorders: Secondary | ICD-10-CM

## 2018-12-27 DIAGNOSIS — F5105 Insomnia due to other mental disorder: Secondary | ICD-10-CM

## 2018-12-27 MED ORDER — SERTRALINE HCL 50 MG PO TABS: ORAL_TABLET | ORAL | 1 refills | Status: DC

## 2018-12-27 NOTE — Progress Notes (Signed)
Jose [name] [id number] 01-Feb-1971 48 y.o.   Virtual Visit via Telephone Note  I connected with pt by telephone and verified that I am speaking with the correct person using two identifiers.   I discussed the limitations, risks, security and privacy concerns of performing an evaluation and management service by telephone and the availability of in person appointments. I also discussed with the patient that there may be a patient responsible charge related to this service. The patient expressed understanding and agreed to proceed.  I discussed the assessment and treatment plan with the patient. The patient was provided an opportunity to ask questions and all were answered. The patient agreed with the plan and demonstrated an understanding of the instructions.   The patient was advised to call back or seek an in-person evaluation if the symptoms worsen or if the condition fails to improve as anticipated.  I provided 30 minutes of non-face-to-face time during this encounter. The call started at 915 and ended at 945. The patient was located at home and the provider was located office.   Subjective:   Patient ID:  Jose [name] is a 48 y.o. (DOB [birth year]) male.  Chief Complaint:  Chief Complaint  Patient presents with  . Follow-up    Medication Management  . Anxiety    Increased   . Other    OCD    HPI Jose [name] presents to the office today for follow-up of anxiety and irritability and prior anger.  This caused him to seek treatment was loss of friendship over anger.  Worked on it.  Last seen January without med changes.  good days and bad days 60/40.  Bad days with a lot of anxiety.  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  Constantly busy and no brakes.  Worry over the business.  Recognizes he's very controlling.  That creates som problems.  Mind never stops.  Counseling helped.  No concerns over meds immediately.  Doesn't think meds working well enough.   She thinks anxiety is worse and triggers anger.  No acting out anger with wife since mid January.  Anxiety doing this over the phone.  Now anxiety greater than anger.  Not really depressed.  Wife doesn't think he's bipolar.  Jose [name] doesn't think so either.  Sleep like a baby now.  Before treatment only 2-3 hours nightly for years.  Sleep better with 1/2 mirtazapine.=7.5 mg nightly.  Appetite and concentration are good.  He is intentionally lost 50 pounds with dietary changes and increased activity.  No obvious manic history but the history of anger outbursts.  Past Psychiatric Medication Trials: Depakote, lamotrigine, Equetro, Trileptal, mirtazapine for sleep, Latuda  Review of Systems:  Review of Systems  Neurological: Negative for tremors and weakness.  lost 50# intentionally  Medications: I have reviewed the patient's current medications.  Current Outpatient Medications  Medication Sig Dispense Refill  . amLODipine (NORVASC) 5 MG tablet 5 mg.    . esomeprazole (NEXIUM) 20 MG capsule Take 20 mg by mouth daily.     . fexofenadine (ALLEGRA) 180 MG tablet Take 180 mg by mouth daily as needed for allergies.     Marland Kitchen HYDROCHLOROTHIAZIDE PO Take 25 mg by mouth daily.     Marland Kitchen lamoTRIgine (LAMICTAL) 100 MG tablet Take 2 tablets (200 mg total) by mouth daily. 180 tablet 1  . losartan (COZAAR) 100 MG tablet Take 100 mg by mouth daily.     . mirtazapine (REMERON) 15 MG tablet Take  1 tablet (15 mg total) by mouth at bedtime. (Patient taking differently: Take 7.5 mg by mouth at bedtime. ) 90 tablet 1  . Oxcarbazepine (TRILEPTAL) 300 MG tablet Take 1 tablet (300 mg total) by mouth 2 (two) times daily. 180 tablet 0  . tamsulosin (FLOMAX) 0.4 MG CAPS capsule 0.4 mg.    . sertraline (ZOLOFT) 50 MG tablet 1/2 tablet for 1 week then 1 daily 30 tablet 1   No current facility-administered medications for this visit.     Medication Side Effects: None  Allergies:  Allergies  Allergen Reactions  . Shellfish  Allergy Anaphylaxis  . Penicillins     Unknown, childhood reaction Has patient had a PCN reaction causing immediate rash, facial/tongue/throat swelling, SOB or lightheadedness with hypotension: Unknown Has patient had a PCN reaction causing severe rash involving mucus membranes or skin necrosis: Unknown Has patient had a PCN reaction that required hospitalization No Has patient had a PCN reaction occurring within the last 10 years: No If all of the above answers are "NO", then may proceed with Cephalosporin use.     Past Medical History:  Diagnosis Date  . Diverticulitis   . GERD (gastroesophageal reflux disease)   . Hypertension   . Seasonal allergies     Family History  Problem Relation Age of Onset  . Heart disease Mother   . Diabetes Mother   . Cancer Father        prostate  . Heart disease Unknown   . Cancer Unknown     Social History   Socioeconomic History  . Marital status: Married    Spouse name: Jose [name]  . Number of children: 2  . Years of education: 66  . Highest education level: Not on file  Occupational History    Employer: [employer]    Comment: [employer]  Social Needs  . Financial resource strain: Not on file  . Food insecurity:    Worry: Not on file    Inability: Not on file  . Transportation needs:    Medical: Not on file    Non-medical: Not on file  Tobacco Use  . Smoking status: Never Smoker  . Smokeless tobacco: Never Used  Substance and Sexual Activity  . Alcohol use: No  . Drug use: No  . Sexual activity: Not on file  Lifestyle  . Physical activity:    Days per week: Not on file    Minutes per session: Not on file  . Stress: Not on file  Relationships  . Social connections:    Talks on phone: Not on file    Gets together: Not on file    Attends religious service: Not on file    Active member of club or organization: Not on file    Attends meetings of clubs or organizations: Not on file    Relationship status: Not on file  .  Intimate partner violence:    Fear of current or ex partner: Not on file    Emotionally abused: Not on file    Physically abused: Not on file    Forced sexual activity: Not on file  Other Topics Concern  . Not on file  Social History Narrative  . Not on file    Past Medical History, Surgical history, Social history, and Family history were reviewed and updated as appropriate.   Please see review of systems for further details on the patient's review from today.   Objective:   Physical Exam:  There were no  vitals taken for this visit.  Physical Exam Neurological:     Mental Status: He is alert and oriented to person, place, and time.     Cranial Nerves: No dysarthria.  Psychiatric:        Attention and Perception: Attention normal. He does not perceive auditory hallucinations.        Mood and Affect: Mood is anxious.        Speech: Speech normal.        Behavior: Behavior is cooperative.        Thought Content: Thought content normal. Thought content is not paranoid or delusional. Thought content does not include homicidal or suicidal ideation. Thought content does not include homicidal or suicidal plan.        Cognition and Memory: Cognition and memory normal.        Judgment: Judgment normal.     Lab Review:     Component Value Date/Time   NA 144 03/12/2012 1100   K 3.6 03/12/2012 1100   CL 104 03/12/2012 1100   CO2 29 03/12/2012 1100   GLUCOSE 108 (H) 03/12/2012 1100   BUN 15 03/12/2012 1100   CREATININE 0.83 03/12/2012 1100   CALCIUM 10.5 03/12/2012 1100   PROT 6.4 10/13/2010 2037   ALBUMIN 4.7 10/13/2010 2037   AST 16 10/13/2010 2037   ALT 19 10/13/2010 2037   ALKPHOS 57 10/13/2010 2037   BILITOT 1.4 (H) 10/13/2010 2037   GFRNONAA >90 03/12/2012 1100   GFRAA >90 03/12/2012 1100       Component Value Date/Time   WBC 10.0 09/27/2010 1417   RBC 5.24 09/27/2010 1417   HGB 16.4 03/12/2012 1100   HCT 45.6 03/12/2012 1100   PLT 218 09/27/2010 1417   MCV  84.2 09/27/2010 1417   MCH 30.2 09/27/2010 1417   MCHC 35.8 09/27/2010 1417   RDW 12.9 09/27/2010 1417   LYMPHSABS 2.9 09/27/2010 1417   MONOABS 0.5 09/27/2010 1417   EOSABS 0.3 09/27/2010 1417   BASOSABS 0.1 09/27/2010 1417    No results found for: POCLITH, LITHIUM   No results found for: PHENYTOIN, PHENOBARB, VALPROATE, CBMZ   .res Assessment: Plan:    Generalized anxiety disorder  Insomnia due to mental condition  History of bipolar disorder  Difficulty controlling anger   Rule out intermittent explosive disorder.  Patient describes history of perfectionism and emotional intensity that sometimes leads to outbursts of anger.  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  Has lost friendships over anger outbursts.  Has worked on it in therapy.  Not a very distinct pattern of cyclic mania but the meds that have been used are meds that are often used to treat anger outbursts effectively.  His anger is reasonably well managed at this time.  However he and his wife feel that his anxiety which sometimes leads to anger is not adequately managed.    He has never been on an SSRI which is the typical long-term treatment strategy for treating chronic anxiety and worry.  I doubt that it will trigger mania.  We discussed the side effects and potential advantages of sertraline for anxiety including sexual side effects and GI side effects among others.  We will start with a low-dose. Sertraline 50 mg tablet 1/2 tablet daily for 5 days then 1 daily  The lamotrigine is probably not accomplishing very much because he is not particularly depressed and is not particularly effective for anger nor anxiety.  Therefore, Reduce lamotrigine to  1 on half of the 100 mg tablets for 1 week and then 100 mg daily  The Trileptal is effective for anger we will leave that unchanged at 300 mg twice daily  The mirtazapine is effective for his sleep at one half of a 15 mg tablet so we will not change  that.  Continue counseling as needed he is.  He is found that helpful  This was a 30-minute appointment  Follow-up 6 weeks  Hiram Comber, MD, DFAPA  Please see After Visit Summary for patient specific instructions.  Future Appointments  Date Time Provider M[place]  01/18/2019  8:00 AM May, Frederick, [facility] CP-CP None  02/01/2019  9:00 AM May, Frederick, [facility] CP-CP None  02/15/2019  9:00 AM May, [place], [facility] CP-CP None  03/01/2019  9:00 AM May, [place], [facility] CP-CP None  03/15/2019  9:00 AM May, [place], [facility] CP-CP None  04/12/2019  9:00 AM May, Frederick, [facility] CP-CP None  04/26/2019  9:00 AM May, [place], [facility] CP-CP None    No orders of the defined types were placed in this encounter.     -------------------------------

## 2019-01-15 ENCOUNTER — Other Ambulatory Visit: Payer: Self-pay

## 2019-01-15 MED ORDER — OXCARBAZEPINE 300 MG PO TABS: 300.0000 mg | ORAL_TABLET | Freq: Two times a day (BID) | ORAL | 0 refills | Status: DC

## 2019-01-18 ENCOUNTER — Encounter: Payer: Self-pay | Admitting: Psychiatry

## 2019-01-18 ENCOUNTER — Other Ambulatory Visit: Payer: Self-pay

## 2019-01-18 ENCOUNTER — Ambulatory Visit (INDEPENDENT_AMBULATORY_CARE_PROVIDER_SITE_OTHER): Payer: Self-pay | Admitting: Psychiatry

## 2019-01-18 DIAGNOSIS — F411 Generalized anxiety disorder: Secondary | ICD-10-CM

## 2019-01-18 NOTE — Progress Notes (Signed)
[facility] Counselor/Therapist Progress Note  Patient ID: Jose [name], MRN: [id number],    Date: 01/18/2019  Time Spent: 50 minutes   Treatment Type: Individual Therapy  Reported Symptoms: anxious, anorgasmia, irritable.  Mental Status Exam:  Appearance:   Casual and Well Groomed     Behavior:  Appropriate  Motor:  Normal  Speech/Language:   Clear and Coherent  Affect:  Appropriate  Mood:  anxious and irritable  Thought process:  normal  Thought content:    WNL  Sensory/Perceptual disturbances:    WNL  Orientation:  oriented to person, place, time/date and situation  Attention:  Good  Concentration:  Good  Memory:  WNL  Fund of knowledge:   Good  Insight:    Good  Judgment:   Good  Impulse Control:  Good   Risk Assessment: Danger to Self:  No Self-injurious Behavior: No Danger to Others: No Duty to Warn:no Physical Aggression / Violence:No  Access to Firearms a concern: No  Gang Involvement:No   Subjective: Telehealth visit I connected with patient by a video enabled telemedicine/telehealth application or telephone, with his informed consent, and verified patient privacy and that I am speaking with the correct person using two identifiers.  I was located at my office and patient at his home.  We discussed the limitations, risks, and security and privacy concerns associated with telehealth services and the availability of in-person appointments, including awareness that he Hertha Gergen be responsible for charges related to the service, and he expressed understanding and agreed to proceed.  I discussed treatment planning with him, with opportunity to ask and answer all questions. Agreed with the plan, demonstrated an understanding of the instructions, and made him aware to call our office if symptoms worsen or he feels he is in a crisis state and needs immediate contact.  The client has met with [name] who started him on sertraline 50 mg.  He reports that so far  things have been going well.  I received a email from his wife this morning stating that she feels like he is done very well.  The client reports that his wife is not at home right now but has gone to [place] to pick up their grandsons.  This does cause him some anxiety.  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  He is trying to relinquish all his control up there and focus on getting himself to a healthier place.  The only side effect that he is noticed from the sertraline has been the anorgasmia.  He says, "in the big picture it is not important enough to make any change.  So I am okay with that."  He reports no anger outburst at all. 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  We discussed other activities that he would find relaxing.  In the past he would do fly fishing but he states he gave that up about 5 years ago.  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  The church it is attached to does not seem to operate anymore.  He finds great satisfaction in this. The client continues to try to speak positively to himself, stay in the present tense and keep the boundaries that he is set up.   Interventions: Assertiveness/Communication, Mindfulness Meditation, Solution-Oriented/Positive Psychology and  Insight-Oriented  Diagnosis:   ICD-10-CM   1. Generalized anxiety disorder F41.1     Plan: exercise, positive self talk, boundaries, mindfulness.  This record has been created using [employer].  Chart creation errors have been sought, but Kelli Robeck not always have been located and corrected. Such creation errors do not reflect on the standard of medical care.  Retina Bernardy, [facility]

## 2019-01-19 ENCOUNTER — Other Ambulatory Visit: Payer: Self-pay | Admitting: Psychiatry

## 2019-02-01 ENCOUNTER — Other Ambulatory Visit: Payer: Self-pay

## 2019-02-01 ENCOUNTER — Ambulatory Visit (INDEPENDENT_AMBULATORY_CARE_PROVIDER_SITE_OTHER): Payer: Self-pay | Admitting: Psychiatry

## 2019-02-01 ENCOUNTER — Encounter: Payer: Self-pay | Admitting: Psychiatry

## 2019-02-01 DIAGNOSIS — F411 Generalized anxiety disorder: Secondary | ICD-10-CM

## 2019-02-01 DIAGNOSIS — F320 Major depressive disorder, single episode, mild: Secondary | ICD-10-CM

## 2019-02-01 NOTE — Progress Notes (Signed)
[facility] Counselor/Therapist Progress Note  Patient ID: Jose [name], MRN: [id number],    Date: 02/01/2019  Time Spent: 50 minutes   Treatment Type: Individual Therapy  Reported Symptoms: irritability, poor sleep  Mental Status Exam:  Appearance:   Casual and Well Groomed     Behavior:  Appropriate  Motor:  Normal  Speech/Language:   Clear and Coherent  Affect:  Appropriate  Mood:  irritable  Thought process:  normal  Thought content:    WNL  Sensory/Perceptual disturbances:    WNL  Orientation:  oriented to person, place, time/date and situation  Attention:  Good  Concentration:  Good  Memory:  WNL  Fund of knowledge:   Good  Insight:    Good  Judgment:   Good  Impulse Control:  Good   Risk Assessment: Danger to Self:  No Self-injurious Behavior: No Danger to Others: No Duty to Warn:no Physical Aggression / Violence:No  Access to Firearms a concern: No  Gang Involvement:No   Subjective: Telehealth visit I connected with patient by a video enabled telemedicine/telehealth application or telephone, with his informed consent, and verified patient privacy and that I am speaking with the correct person using two identifiers.  I was located at my office and patient at his home.  We discussed the limitations, risks, and security and privacy concerns associated with telehealth services and the availability of in-person appointments, including awareness that he Mardella Nuckles be responsible for charges related to the service, and he expressed understanding and agreed to proceed.  I discussed treatment planning with him, with opportunity to ask and answer all questions. Agreed with the plan, demonstrated an understanding of the instructions, and made him aware to call our office if symptoms worsen or he feels he is in a crisis state and needs immediate contact.  Client reports that he has had trouble with his sleep over the last week.  He is getting 6 hours versus 8 hours.  He  has also had his grandson for the last 2 weeks.  He does say that this has been a pleasant time with his grandson. The client is taking the sertraline 50 mg.  "I am still very irritable.  It seems to interfere with my sleep."  I asked the client how he was taking his sertraline.  He states that he takes it at night before he goes to bed with his other meds.  He has been having trouble with sleep since doing this.  I suggested that he just take it in the morning versus in the evening.  He will not take it tonight and take it in the morning.  He agrees. He does describe more irritability.  "I Adir Schicker get angry but I am in control of my anger."  The client agrees that he seems to have a much longer fuse. He does note with the onset of the rain the last 10 days that he has not been able to work outside like he normally would or get the cardio vascular exercise.  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  Not being able to be outside has been difficult for him.  He will make an effort to try to do 30 minutes of some type of cardio each day.   Interventions: Solution-Oriented/Positive Psychology and Insight-Oriented  Diagnosis:   ICD-10-CM   1. Generalized anxiety disorder F41.1   2. Major depressive disorder, single episode, mild (HCC) F32.0     Plan: Take sertraline  I the morning due to sleep interference, self care, cardio.  This record has been created using [employer].  Chart creation errors have been sought, but Kawthar Ennen not always have been located and corrected. Such creation errors do not reflect on the standard of medical care.   Momina Hunton, [facility]

## 2019-02-15 ENCOUNTER — Other Ambulatory Visit: Payer: Self-pay

## 2019-02-15 ENCOUNTER — Encounter: Payer: Self-pay | Admitting: Psychiatry

## 2019-02-15 ENCOUNTER — Ambulatory Visit (INDEPENDENT_AMBULATORY_CARE_PROVIDER_SITE_OTHER): Payer: Self-pay | Admitting: Psychiatry

## 2019-02-15 DIAGNOSIS — F5105 Insomnia due to other mental disorder: Secondary | ICD-10-CM

## 2019-02-15 DIAGNOSIS — F411 Generalized anxiety disorder: Secondary | ICD-10-CM

## 2019-02-15 DIAGNOSIS — F320 Major depressive disorder, single episode, mild: Secondary | ICD-10-CM

## 2019-02-15 DIAGNOSIS — R454 Irritability and anger: Secondary | ICD-10-CM

## 2019-02-15 DIAGNOSIS — F39 Unspecified mood [affective] disorder: Secondary | ICD-10-CM

## 2019-02-15 NOTE — Progress Notes (Signed)
      [facility] Counselor/Therapist Progress Note  Patient ID: Jose [name], MRN: [id number],    Date: 02/15/2019  Time Spent: 50 minutes   Treatment Type: Individual Therapy  Reported Symptoms: frustration, irritable  Mental Status Exam:  Appearance:   Casual and Well Groomed     Behavior:  Appropriate  Motor:  Normal  Speech/Language:   Clear and Coherent  Affect:  Appropriate  Mood:  irritable  Thought process:  normal  Thought content:    WNL  Sensory/Perceptual disturbances:    WNL  Orientation:  oriented to person, place, time/date and situation  Attention:  Good  Concentration:  Good  Memory:  WNL  Fund of knowledge:   Good  Insight:    Good  Judgment:   Good  Impulse Control:  Good   Risk Assessment: Danger to Self:  No Self-injurious Behavior: No Danger to Others: No Duty to Warn:no Physical Aggression / Violence:No  Access to Firearms a concern: No  Gang Involvement:No   Subjective: I met the client face-to-face.  We both had facemasks. "I am irritable a lot.  I feel somewhat in control though."  The client states that in the big picture Zoloft has helped improve his mood but he still struggles with the anxiety.  He pretty much stays home on his property.  He does not go into the office because he can get too revved up there.  He talked today about his mildly racing continually going over conversations and a ruminative fashion.  He is always evaluating what he said.  Trying to determine if what he said was correct.  Ultimately I believe it comes down to his fear of other people's judgment or being inadequate in some way. We discussed that a lot of this Anneth Brunell be coming out of his trauma in addition to his depressed mood.  He does agree to this.  I explained again to the client that the small amount of EMDR that we have done has been very helpful for the client as he admits but there is more work to be done.  He agrees.  Next time will focus on his irritable mood  and some of the negative cognitions that chronically run through his mind.  Interventions: Motivational Interviewing, Solution-Oriented/Positive Psychology and Insight-Oriented  Diagnosis:   ICD-10-CM   1. Major depressive disorder, single episode, mild ([place])  F32.0     Plan: self care, exercise, positive self talk.  This record has been created using [employer].  Chart creation errors have been sought, but Jenny Lai not always have been located and corrected. Such creation errors do not reflect on the standard of medical care.  Clarivel Callaway, [facility]

## 2019-02-15 NOTE — Progress Notes (Addendum)
MYAN SUIT [id number] 1970/11/10 48 y.o.  Subjective:   Patient ID:  Jose [name] is a 48 y.o. (DOB 1970-09-09) male.  Chief Complaint:  Chief Complaint  Patient presents with  . Follow-up    Medication Management  . Other    OCD    HPI Jose [name] presents to the office today for follow-up of anger, irritability and anxiety.  Last visit in April 23.  Started sertraline 50 mg and reduced lamotrigine to 100 mg daily.  Still irritable but better control of the anger.  Works from home.  Anxiety is the same.  Anxious coming here and anxiety starting the day.  Initially sertraline at HS caused insomnia and wired him. Up.    Patient reports stable mood and denies depressed but is irritable moods.   Patient denies difficulty with sleep initiation or maintenance. Denies appetite disturbance.  Patient reports that energy and motivation have been good.  Patient denies any difficulty with concentration.  Patient denies any suicidal ideation.  Repeatedly ruminates over social interactions and what he said or did or could have said.  Anxiety socially.  No anxiety.  Again disc possible manic history.  Had trouble sleeping until mirtazapine.  On [place] obsessive-compulsive inventory he answered yes to obsessive thoughts 1 through 5, no to 6 and 7 yes to 7 and 8, noted #10 yes to 1112 and 13, no 214 1516, yes to 17, no to 1819, yesterday #20.  On part B he answered to his extreme to questions 124 and 5 and little control of #3.  Past Psychiatric Medication Trials: Depakote, lamotrigine, Equetro, Sertraline 50,  Trileptal 300 BID,  Latuda 20  Review of Systems:  Review of Systems  Neurological: Negative for tremors and weakness.    Medications: I have reviewed the patient's current medications.  Current Outpatient Medications  Medication Sig Dispense Refill  . amLODipine (NORVASC) 5 MG tablet 5 mg.    . esomeprazole (NEXIUM) 20 MG capsule Take 20 mg by mouth daily.     .  fexofenadine (ALLEGRA) 180 MG tablet Take 180 mg by mouth daily as needed for allergies.     Marland Kitchen HYDROCHLOROTHIAZIDE PO Take 25 mg by mouth daily.     Marland Kitchen losartan (COZAAR) 100 MG tablet Take 100 mg by mouth daily.     . mirtazapine (REMERON) 15 MG tablet Take 1 tablet (15 mg total) by mouth at bedtime. (Patient taking differently: Take 7.5 mg by mouth at bedtime. ) 90 tablet 1  . Oxcarbazepine (TRILEPTAL) 300 MG tablet Take 1 tablet (300 mg total) by mouth 2 (two) times daily. 180 tablet 0  . sertraline (ZOLOFT) 50 MG tablet TAKE 1/2 TABLET BY MOUTH FOR 1 WEEK THEN 1 TABLET DAILY 90 tablet 0  . tamsulosin (FLOMAX) 0.4 MG CAPS capsule 0.4 mg.     No current facility-administered medications for this visit.     Medication Side Effects: None  Allergies:  Allergies  Allergen Reactions  . Shellfish Allergy Anaphylaxis  . Penicillins     Unknown, childhood reaction Has patient had a PCN reaction causing immediate rash, facial/tongue/throat swelling, SOB or lightheadedness with hypotension: Unknown Has patient had a PCN reaction causing severe rash involving mucus membranes or skin necrosis: Unknown Has patient had a PCN reaction that required hospitalization No Has patient had a PCN reaction occurring within the last 10 years: No If all of the above answers are "NO", then may proceed with Cephalosporin use.     Past Medical  History:  Diagnosis Date  . Diverticulitis   . GERD (gastroesophageal reflux disease)   . Hypertension   . Seasonal allergies     Family History  Problem Relation Age of Onset  . Heart disease Mother   . Diabetes Mother   . Cancer Father        prostate  . Heart disease Unknown   . Cancer Unknown     Social History   Socioeconomic History  . Marital status: Married    Spouse name: [name]  . Number of children: 2  . Years of education: 43  . Highest education level: Not on file  Occupational History    Employer: [employer]    Comment: [employer]  Social  Needs  . Financial resource strain: Not on file  . Food insecurity    Worry: Not on file    Inability: Not on file  . Transportation needs    Medical: Not on file    Non-medical: Not on file  Tobacco Use  . Smoking status: Never Smoker  . Smokeless tobacco: Never Used  Substance and Sexual Activity  . Alcohol use: No  . Drug use: No  . Sexual activity: Not on file  Lifestyle  . Physical activity    Days per week: Not on file    Minutes per session: Not on file  . Stress: Not on file  Relationships  . Social Herbalist on phone: Not on file    Gets together: Not on file    Attends religious service: Not on file    Active member of club or organization: Not on file    Attends meetings of clubs or organizations: Not on file    Relationship status: Not on file  . Intimate partner violence    Fear of current or ex partner: Not on file    Emotionally abused: Not on file    Physically abused: Not on file    Forced sexual activity: Not on file  Other Topics Concern  . Not on file  Social History Narrative  . Not on file    Past Medical History, Surgical history, Social history, and Family history were reviewed and updated as appropriate.   Please see review of systems for further details on the patient's review from today.   Objective:   Physical Exam:  There were no vitals taken for this visit.  Physical Exam Constitutional:      General: He is not in acute distress.    Appearance: He is well-developed.  Musculoskeletal:        General: No deformity.  Neurological:     Mental Status: He is alert and oriented to person, place, and time.     Coordination: Coordination normal.  Psychiatric:        Attention and Perception: Attention normal. He is attentive.        Mood and Affect: Mood is anxious. Mood is not depressed. Affect is not labile, blunt, angry or inappropriate.        Speech: Speech normal.        Behavior: Behavior normal.        Thought  Content: Thought content normal. Thought content does not include homicidal or suicidal ideation. Thought content does not include homicidal or suicidal plan.        Cognition and Memory: Cognition normal.        Judgment: Judgment normal.     Comments: Insight is fair.  He still  has irritability but was certainly appropriate in the office.     Lab Review:     Component Value Date/Time   NA 144 03/12/2012 1100   K 3.6 03/12/2012 1100   CL 104 03/12/2012 1100   CO2 29 03/12/2012 1100   GLUCOSE 108 (H) 03/12/2012 1100   BUN 15 03/12/2012 1100   CREATININE 0.83 03/12/2012 1100   CALCIUM 10.5 03/12/2012 1100   PROT 6.4 10/13/2010 2037   ALBUMIN 4.7 10/13/2010 2037   AST 16 10/13/2010 2037   ALT 19 10/13/2010 2037   ALKPHOS 57 10/13/2010 2037   BILITOT 1.4 (H) 10/13/2010 2037   GFRNONAA >90 03/12/2012 1100   GFRAA >90 03/12/2012 1100       Component Value Date/Time   WBC 10.0 09/27/2010 1417   RBC 5.24 09/27/2010 1417   HGB 16.4 03/12/2012 1100   HCT 45.6 03/12/2012 1100   PLT 218 09/27/2010 1417   MCV 84.2 09/27/2010 1417   MCH 30.2 09/27/2010 1417   MCHC 35.8 09/27/2010 1417   RDW 12.9 09/27/2010 1417   LYMPHSABS 2.9 09/27/2010 1417   MONOABS 0.5 09/27/2010 1417   EOSABS 0.3 09/27/2010 1417   BASOSABS 0.1 09/27/2010 1417    No results found for: POCLITH, LITHIUM   No results found for: PHENYTOIN, PHENOBARB, VALPROATE, CBMZ   .res Assessment: Plan:    Percival was seen today for follow-up and other.  Diagnoses and all orders for this visit:  Episodic mood disorder ([place])  Generalized anxiety disorder  Anger  Insomnia due to mental condition  History of bipolar disorder versus major depression Rule out intermittent explosive disorder   Patient describes history of perfectionism and emotional intensity that sometimes leads to outbursts of anger.  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  Has lost friendships over anger outbursts.  Has  worked on it in therapy. Unclear about a  distinct pattern of cyclic mania but the meds that have been used are meds that are often used to treat anger outbursts effectively.  His anger is reasonably well managed at this time.  However he and his wife feel that his anxiety which sometimes leads to anger is not adequately managed.  He apparently did not get very much benefit from the sertraline but did initially feel hyper on it when he took it at night and interfered with sleep.  This makes me somewhat reluctant to increase the dosage today.  The goal is to better control his anxiety but there is still is a question of whether he has bipolar tendencies in which case increase in the sertraline could make things worse.  He has not read much about bipolar disorder.  Increase oxcarbazepine to 1 tablet in the morning and 2 in evening this has the potential to help anxiety and irritability also and does not have a risk of making him more hyper.  He has never taken higher than 300 mg twice daily before.  Continue sertraline 50 mg in the morning.  That are increase at next visitThe lamotrigine is probably not accomplishing very much because he is not particularly depressed and is not particularly effective for anger nor anxiety.   Reduce lamotrigine to 50 mg daily for 2 weeks and stop. It is not likely that this medicine is accomplishing very much.  But he is aware to look for depression if once he stops it.  The mirtazapine is effective for his sleep at one half of a 15 mg tablet so  we will not change that.  Gave him [place] obsessive-compulsive disorder inventory to evaluate the possibility of OCD.  Continue counseling as needed he is.  He is found that helpful  This was a 30-minute appointment  Follow-up 6 weeks  Lynder Parents, MD, DFAPA   Please see After Visit Summary for patient specific instructions.  Increase oxcarbazepine to 1 tablet in the morning and 2 in evening.   Reduce lamotrigine  to 1/2 of 100 mg tablet for 2 weeks and stop it.  Future Appointments  Date Time Provider [place]  03/01/2019  9:00 AM May, [place], [facility] CP-CP None  03/15/2019  9:00 AM May, Frederick, [facility] CP-CP None  04/12/2019  9:00 AM May, [place], [facility] CP-CP None  04/24/2019  9:30 AM Cottle, Billey Co., MD CP-CP None  04/26/2019  9:00 AM Clarita Leber, [facility] CP-CP None    No orders of the defined types were placed in this encounter.   -------------------------------

## 2019-02-15 NOTE — Patient Instructions (Signed)
Increase oxcarbazepine to 1 tablet in the morning and 2 in evening.  Reduce lamotrigine to 1/2 of 100 mg tablet for 2 weeks and stop it.

## 2019-03-01 ENCOUNTER — Other Ambulatory Visit: Payer: Self-pay

## 2019-03-01 ENCOUNTER — Encounter: Payer: Self-pay | Admitting: Psychiatry

## 2019-03-01 ENCOUNTER — Ambulatory Visit (INDEPENDENT_AMBULATORY_CARE_PROVIDER_SITE_OTHER): Payer: Self-pay | Admitting: Psychiatry

## 2019-03-01 DIAGNOSIS — F411 Generalized anxiety disorder: Secondary | ICD-10-CM

## 2019-03-01 NOTE — Progress Notes (Signed)
[facility] Counselor/Therapist Progress Note  Patient ID: EMAAD NANNA, MRN: [id number],    Date: 03/01/2019  Time Spent: 51 minutes   Treatment Type: Individual Therapy  Reported Symptoms: anxiety, irritability.  Mental Status Exam:  Appearance:   Casual     Behavior:  Appropriate  Motor:  Normal  Speech/Language:   Clear and Coherent  Affect:  Appropriate  Mood:  anxious and irritable  Thought process:  normal  Thought content:    Rumination  Sensory/Perceptual disturbances:    WNL  Orientation:  oriented to person, place, time/date and situation  Attention:  Good  Concentration:  Good  Memory:  WNL  Fund of knowledge:   Good  Insight:    Good  Judgment:   Good  Impulse Control:  Good   Risk Assessment: Danger to Self:  No Self-injurious Behavior: No Danger to Others: No Duty to Warn:no Physical Aggression / Violence:No  Access to Firearms a concern: No  Gang Involvement:No   Subjective: Telehealth visit -- I connected with this patient by an approved telecommunication method (video), with his informed consent, and verifying identity and patient privacy.  I was located at my office and patient at his home.  As needed, we discussed the limitations, risks, and security and privacy concerns associated with telehealth service, including the availability and conditions which currently govern in-person appointments and the possibility that 3rd-party payment Lucrezia Dehne not be fully guaranteed and he Dominik Lauricella be responsible for charges.  After he indicated understanding, we proceeded with the session.  Also discussed treatment planning, as needed, including ongoing verbal agreement with the plan, the opportunity to ask and answer all questions, his demonstrated understanding of instructions, and his readiness to call the office should symptoms worsen or he feels he is in a crisis state and needs more immediate and tangible assistance. The client reports that he is still very  irritable.  I asked him to rate his irritability today.  On his subjective units of distress scale 0-10 with 10 being the most he rated his irritability at 1.  He states that during the day, "my mind is rolling on 10,000 different thoughts at a time."  He states his sleep is much better and that the Zoloft seems to be working.  He is currently at 50 mg and requested to see if [name] would increase it.  I did message [name] who agreed to the increase and will have the nurse Traci Scroggins contact the client. I discussed with the client about ways he could control his thoughts more and hopefully reduce his irritability.  1 of the mindfulness activities that I wanted the client to practice with was singing.  The client loves to sing and this would be a way of disrupting the negative thought process and hopefully changing his mood.  We discussed that when his mood becomes irritable he should actively participate in activities that will change his mood for the positive.  This could be listening to music, watching a movie, reading a book, or exercising.  The client agreed to try.  He states he loves to listen to music and has wireless headphones that could be very helpful. He reports his sleep is still good and he will maintain with his current medication regimen.     Interventions: Mindfulness Meditation, Motivational Interviewing, Solution-Oriented/Positive Psychology and Insight-Oriented  Diagnosis:   ICD-10-CM   1. Generalized anxiety disorder  F41.1     Plan: Practice mindfulness activities such as  singing, use distraction with music, exercise, good sleep hygiene, self-care.  This record has been created using [employer].  Chart creation errors have been sought, but Ceana Fiala not always have been located and corrected. Such creation errors do not reflect on the standard of medical care.  Jemeka Wagler, [facility]

## 2019-03-06 ENCOUNTER — Telehealth: Payer: Self-pay

## 2019-03-06 ENCOUNTER — Other Ambulatory Visit: Payer: Self-pay

## 2019-03-06 MED ORDER — SERTRALINE HCL 100 MG PO TABS: ORAL_TABLET | ORAL | 1 refills | Status: DC

## 2019-03-06 NOTE — Telephone Encounter (Signed)
-----   Message from Purnell Shoemaker., MD sent at 03/01/2019 11:16 AM EDT ----- Regarding: increase in sertraline Per therapist [name] LPC:  I had a session with Jose [name] today.  He feels like the Zoloft is helping him and asked if he could increase it. He is off the lamotragine and is taking the extra Trileptal at night. Thanks!  Patient is asking to increase sertraline for OCD.  Did an OCD inventory at last appointment and it was significantly positive suggesting OCD.  We have added Trileptal to help address irritability and mood swings which should make it safer to increase the sertraline at this time.  He is likely to have better antianxiety effects and better response for OCD with a higher dose of sertraline.Therefore increase sertraline from 50 mg to 100 mg daily.Jose Parents, MD, DFAPA

## 2019-03-06 NOTE — Telephone Encounter (Signed)
Called and spoke to pt and instructed him to increase Sertraline to 100 mg daily. Verbalized understanding. Sent updated rx to pharmacy.

## 2019-03-15 ENCOUNTER — Ambulatory Visit (INDEPENDENT_AMBULATORY_CARE_PROVIDER_SITE_OTHER): Payer: Self-pay | Admitting: Psychiatry

## 2019-03-15 ENCOUNTER — Other Ambulatory Visit: Payer: Self-pay

## 2019-03-15 ENCOUNTER — Encounter: Payer: Self-pay | Admitting: Psychiatry

## 2019-03-15 DIAGNOSIS — F411 Generalized anxiety disorder: Secondary | ICD-10-CM

## 2019-03-15 NOTE — Progress Notes (Signed)
      [facility] Counselor/Therapist Progress Note  Patient ID: Jose [name], MRN: [id number],    Date: 03/15/2019  Time Spent: 50 minutes   Treatment Type: Individual Therapy  Reported Symptoms: aniety, irritation  Mental Status Exam:  Appearance:   Casual and Well Groomed     Behavior:  Appropriate  Motor:  Normal  Speech/Language:   Clear and Coherent  Affect:  Appropriate  Mood:  anxious and irritable  Thought process:  normal  Thought content:    WNL  Sensory/Perceptual disturbances:    WNL  Orientation:  oriented to person, place, time/date and situation  Attention:  Good  Concentration:  Good  Memory:  WNL  Fund of knowledge:   Good  Insight:    Good  Judgment:   Good  Impulse Control:  Good   Risk Assessment: Danger to Self:  No Self-injurious Behavior: No Danger to Others: No Duty to Warn:no Physical Aggression / Violence:No  Access to Firearms a concern: No  Gang Involvement:No   Subjective: I met with the client face-to-face.  We both had facemasks. The client states that his wife wants him to go through his work history with me.  Rather than take the whole session talking about that I suggested to the client that he write out his work history.  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  The client's negative cognition is, "they are not running the business the way I would."  He feels frustration and anxiety in his head.  His subjective units of distress is a 6+.  We were able to reduce it to less than 2 at the end of the session. As the client processed this with eye-movement as well as the bilateral stimulation hand paddles he noted that sometimes he puts too much emphasis on his business over his personal relationships.  He did state that he had gone to his mother's grave this past Saturday and wept.  He had not been to her grave in a very long time.  As the client continue  to process things about power and control kept coming up.  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  Interventions: Assertiveness/Communication, Motivational Interviewing, Solution-Oriented/Positive Psychology, [employer] Desensitization and Reprocessing (EMDR) and Insight-Oriented  Diagnosis:   ICD-10-CM   1. Generalized anxiety disorder  F41.1     Plan: Boundaries, self-care, exercise, positive self talk.  This record has been created using [employer].  Chart creation errors have been sought, but Joseangel Nettleton not always have been located and corrected. Such creation errors do not reflect on the standard of medical care.  Huriel Matt, [facility]

## 2019-03-21 ENCOUNTER — Other Ambulatory Visit: Payer: Self-pay

## 2019-03-21 MED ORDER — MIRTAZAPINE 15 MG PO TABS: 7.5000 mg | ORAL_TABLET | Freq: Every day | ORAL | 1 refills | Status: DC

## 2019-04-05 ENCOUNTER — Other Ambulatory Visit: Payer: Self-pay | Admitting: Psychiatry

## 2019-04-12 ENCOUNTER — Ambulatory Visit (INDEPENDENT_AMBULATORY_CARE_PROVIDER_SITE_OTHER): Payer: Self-pay | Admitting: Psychiatry

## 2019-04-12 ENCOUNTER — Other Ambulatory Visit: Payer: Self-pay

## 2019-04-12 ENCOUNTER — Encounter: Payer: Self-pay | Admitting: Psychiatry

## 2019-04-12 DIAGNOSIS — F411 Generalized anxiety disorder: Secondary | ICD-10-CM

## 2019-04-12 NOTE — Progress Notes (Signed)
[facility] Counselor/Therapist Progress Note  Patient ID: Jose [name], MRN: [id number],    Date: 04/12/2019  Time Spent: 50 minutes   Treatment Type: Individual Therapy  Reported Symptoms: anxious, irritable.  Mental Status Exam:  Appearance:   Casual     Behavior:  Appropriate  Motor:  Normal  Speech/Language:   Clear and Coherent  Affect:  Appropriate  Mood:  anxious and irritable  Thought process:  normal  Thought content:    WNL  Sensory/Perceptual disturbances:    WNL  Orientation:  oriented to person, place, time/date and situation  Attention:  Good  Concentration:  Good  Memory:  WNL  Fund of knowledge:   Good  Insight:    Good  Judgment:   Good  Impulse Control:  Good   Risk Assessment: Danger to Self:  No Self-injurious Behavior: No Danger to Others: No Duty to Warn:no Physical Aggression / Violence:No  Access to Firearms a concern: No  Gang Involvement:No   Subjective: Telehealth visit -- I connected with this patient by an approved telecommunication method (video), with his informed consent, and verifying identity and patient privacy.  I was located at my office and patient at his home.  As needed, we discussed the limitations, risks, and security and privacy concerns associated with telehealth service, including the availability and conditions which currently govern in-person appointments and the possibility that 3rd-party payment Jose [name] not be fully guaranteed and he Jose [name] be responsible for charges.  After he indicated understanding, we proceeded with the session.  Also discussed treatment planning, as needed, including ongoing verbal agreement with the plan, the opportunity to ask and answer all questions, his demonstrated understanding of instructions, and his readiness to call the office should symptoms worsen or he feels he is in a crisis state and needs more immediate and tangible assistance. The client states that the increase in his sertraline from  50 to 100 mg has had a positive impact.  He states his thinking is clear and thoughts have been calmed down.  He finds that he has had less anger outbursts or possibly none at all.  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  This actually would have caused him a lot more irritability/anxiety in the past but he seemed very cheerful on the phone. The client finds that the mindfulness exercise of singing when he is working outside has helped control his thought process in a positive way.  He knows that he likes to do projects because he does not do as well with time on his hands.  I asked the client how many projects he had going on now which is 6.  I asked the client how many projects is it reasonable for him to have and he stated 5-6.  I asked him to be intentional about not taking on more projects so that he does not exceed that number.  He thought this was a good idea and agreed to do so. 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  They also had a big layout in the Sunday section of the paper.  This was very positive for the client and he was clearly proud of his wife.  He seems to be much more control of his emotions and thought process.  I did not pick up any uncertainty or significant anxiety.  Interventions: Mindfulness Meditation, Motivational Interviewing, Solution-Oriented/Positive Psychology and Insight-Oriented  Diagnosis:   ICD-10-CM   1. Generalized anxiety disorder  F41.1     Plan: Mindfulness, exercise, boundaries, self care.  This record has been created using [employer].  Chart creation errors have been sought, but Jose [name] not always have been located and corrected. Such creation errors do not reflect on the standard of medical care.  Jose [name], [facility]

## 2019-04-15 ENCOUNTER — Other Ambulatory Visit: Payer: Self-pay | Admitting: Psychiatry

## 2019-04-24 ENCOUNTER — Ambulatory Visit (INDEPENDENT_AMBULATORY_CARE_PROVIDER_SITE_OTHER): Payer: Self-pay | Admitting: Psychiatry

## 2019-04-24 ENCOUNTER — Encounter: Payer: Self-pay | Admitting: Psychiatry

## 2019-04-24 ENCOUNTER — Other Ambulatory Visit: Payer: Self-pay

## 2019-04-24 DIAGNOSIS — F411 Generalized anxiety disorder: Secondary | ICD-10-CM

## 2019-04-24 DIAGNOSIS — F5105 Insomnia due to other mental disorder: Secondary | ICD-10-CM

## 2019-04-24 DIAGNOSIS — F39 Unspecified mood [affective] disorder: Secondary | ICD-10-CM

## 2019-04-24 DIAGNOSIS — R454 Irritability and anger: Secondary | ICD-10-CM

## 2019-04-24 NOTE — Progress Notes (Signed)
[name] [id number] April 11, 1971 48 y.o.  Subjective:   Patient ID:  Jose [name] is a 48 y.o. (DOB 19-Sep-1970) male.  Chief Complaint:  Chief Complaint  Patient presents with  . Follow-up    Medication Management  . Other    OCD    HPI Jose [name] presents to the office today for follow-up of anger, irritability and anxiety.  At visit in April 23.  Started sertraline 50 mg and reduced lamotrigine to 100 mg daily.  Still irritable but better control of the anger.    Last seen in June 2020.  For continued symptoms oxcarbazepine was increased to 900 mg daily.  He was weaned off lamotrigine because it was not felt to be very helpful.  He continued mirtazapine for sleep.  He continued low-dose sertraline for anxiety because Initially sertraline at HS caused insomnia and wired him. Up.    Since then called and increased the sertraline to 100 mg daily and it helped.  Change to 100 mg on July 1.  Can let go of anger quicker.  Wife noticed I don't hold onto it.   still get mad but handling it better.  Been angry all his life.  Grew up in a bad situation with bipolar, schizophrenic with hospitalizations as a kid.  Has a farm with equipment.  Anxiety is high bc involved in a project.  Still jump on people including father and sister yesterday.  I've done well for myself and I tolerate very little.  But is generous.   Probably way worse than he really lets on.  I got issues.  No sex drive now with sertraline.  Patient reports stable mood and denies depressed but is irritable moods.  He has struggled with anger all his life and does not believe that we will ever fully resolved.  Patient denies difficulty with sleep initiation or maintenance. Sleep like a baby.  Denies appetite disturbance.  Patient reports that energy and motivation have been good.  Patient denies any difficulty with concentration.  Patient denies any suicidal ideation.  Repeatedly ruminates over social interactions and  what he said or did or could have said.  Anxiety socially.  No anxiety.  Again disc possible manic history.  Had trouble sleeping until mirtazapine.  On [place] obsessive-compulsive inventory he answered yes to obsessive thoughts 1 through 5, no to 6 and 7 yes to 7 and 8, noted #10 yes to 1112 and 13, no 214 1516, yes to 17, no to 1819, yesterday #20.  On part B he answered to his extreme to questions 124 and 5 and little control of #3.  Past Psychiatric Medication Trials: Depakote, lamotrigine, Equetro, Sertraline 50 ,  Trileptal 300 BID,  Latuda 20  Review of Systems:  Review of Systems  Neurological: Negative for tremors and weakness.    Medications: I have reviewed the patient's current medications.  Current Outpatient Medications  Medication Sig Dispense Refill  . amLODipine (NORVASC) 5 MG tablet 5 mg.    . esomeprazole (NEXIUM) 20 MG capsule Take 20 mg by mouth daily.     . fexofenadine (ALLEGRA) 180 MG tablet Take 180 mg by mouth daily as needed for allergies.     Marland Kitchen HYDROCHLOROTHIAZIDE PO Take 25 mg by mouth daily.     Marland Kitchen losartan (COZAAR) 100 MG tablet Take 100 mg by mouth daily.     . mirtazapine (REMERON) 15 MG tablet Take 0.5-1 tablets (7.5-15 mg total) by mouth at bedtime. 90 tablet 1  .  Oxcarbazepine (TRILEPTAL) 300 MG tablet TAKE 1 TABLET (300 MG TOTAL) BY MOUTH 2 (TWO) TIMES DAILY. 180 tablet 0  . sertraline (ZOLOFT) 100 MG tablet TAKE 1 TABLET DAILY 90 tablet 1  . tamsulosin (FLOMAX) 0.4 MG CAPS capsule 0.4 mg.     No current facility-administered medications for this visit.     Medication Side Effects:  Marked sexual SE.    Allergies:  Allergies  Allergen Reactions  . Shellfish Allergy Anaphylaxis  . Penicillins     Unknown, childhood reaction Has patient had a PCN reaction causing immediate rash, facial/tongue/throat swelling, SOB or lightheadedness with hypotension: Unknown Has patient had a PCN reaction causing severe rash involving mucus membranes or skin  necrosis: Unknown Has patient had a PCN reaction that required hospitalization No Has patient had a PCN reaction occurring within the last 10 years: No If all of the above answers are "NO", then may proceed with Cephalosporin use.     Past Medical History:  Diagnosis Date  . Diverticulitis   . GERD (gastroesophageal reflux disease)   . Hypertension   . Seasonal allergies     Family History  Problem Relation Age of Onset  . Heart disease Mother   . Diabetes Mother   . Cancer Father        prostate  . Heart disease Unknown   . Cancer Unknown     Social History   Socioeconomic History  . Marital status: Married    Spouse name: Jose [name]  . Number of children: 2  . Years of education: 79  . Highest education level: Not on file  Occupational History    Employer: [employer]    Comment: [employer]  Social Needs  . Financial resource strain: Not on file  . Food insecurity    Worry: Not on file    Inability: Not on file  . Transportation needs    Medical: Not on file    Non-medical: Not on file  Tobacco Use  . Smoking status: Never Smoker  . Smokeless tobacco: Never Used  Substance and Sexual Activity  . Alcohol use: No  . Drug use: No  . Sexual activity: Not on file  Lifestyle  . Physical activity    Days per week: Not on file    Minutes per session: Not on file  . Stress: Not on file  Relationships  . Social Herbalist on phone: Not on file    Gets together: Not on file    Attends religious service: Not on file    Active member of club or organization: Not on file    Attends meetings of clubs or organizations: Not on file    Relationship status: Not on file  . Intimate partner violence    Fear of current or ex partner: Not on file    Emotionally abused: Not on file    Physically abused: Not on file    Forced sexual activity: Not on file  Other Topics Concern  . Not on file  Social History Narrative  . Not on file    Past Medical History,  Surgical history, Social history, and Family history were reviewed and updated as appropriate.   Please see review of systems for further details on the patient's review from today.   Objective:   Physical Exam:  There were no vitals taken for this visit.  Physical Exam Constitutional:      General: He is not in acute distress.  Appearance: He is well-developed. He is obese.  Musculoskeletal:        General: No deformity.  Neurological:     Mental Status: He is alert and oriented to person, place, and time.     Coordination: Coordination normal.  Psychiatric:        Attention and Perception: Attention normal. He is attentive.        Mood and Affect: Mood is anxious. Mood is not depressed. Affect is not labile, blunt, angry or inappropriate.        Speech: Speech normal.        Behavior: Behavior normal.        Thought Content: Thought content normal. Thought content does not include homicidal or suicidal ideation. Thought content does not include homicidal or suicidal plan.        Cognition and Memory: Cognition normal.        Judgment: Judgment normal.     Comments: Insight is fair.  He still has irritability but was certainly appropriate in the office. His affect is fairly intense but not manic.     Lab Review:     Component Value Date/Time   NA 144 03/12/2012 1100   K 3.6 03/12/2012 1100   CL 104 03/12/2012 1100   CO2 29 03/12/2012 1100   GLUCOSE 108 (H) 03/12/2012 1100   BUN 15 03/12/2012 1100   CREATININE 0.83 03/12/2012 1100   CALCIUM 10.5 03/12/2012 1100   PROT 6.4 10/13/2010 2037   ALBUMIN 4.7 10/13/2010 2037   AST 16 10/13/2010 2037   ALT 19 10/13/2010 2037   ALKPHOS 57 10/13/2010 2037   BILITOT 1.4 (H) 10/13/2010 2037   GFRNONAA >90 03/12/2012 1100   GFRAA >90 03/12/2012 1100       Component Value Date/Time   WBC 10.0 09/27/2010 1417   RBC 5.24 09/27/2010 1417   HGB 16.4 03/12/2012 1100   HCT 45.6 03/12/2012 1100   PLT 218 09/27/2010 1417   MCV  84.2 09/27/2010 1417   MCH 30.2 09/27/2010 1417   MCHC 35.8 09/27/2010 1417   RDW 12.9 09/27/2010 1417   LYMPHSABS 2.9 09/27/2010 1417   MONOABS 0.5 09/27/2010 1417   EOSABS 0.3 09/27/2010 1417   BASOSABS 0.1 09/27/2010 1417    No results found for: POCLITH, LITHIUM   No results found for: PHENYTOIN, PHENOBARB, VALPROATE, CBMZ   .res Assessment: Plan:    Algie was seen today for follow-up and other.  Diagnoses and all orders for this visit:  Episodic mood disorder ([place])  Generalized anxiety disorder  Anger  Insomnia due to mental condition   History of bipolar disorder versus major depression Rule out intermittent explosive disorder Rule out OCD variant but it seems to just focus on perfectionism.  Greater than 50% of face to face time with patient was spent on counseling and coordination of care. We discussed Patient describes history of perfectionism and emotional intensity that sometimes leads to outbursts of anger.  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  Has lost friendships over anger outbursts.  Has worked on it in therapy. Unclear about a  distinct pattern of cyclic mania but the meds that have been used are meds that are often used to treat anger outbursts effectively.   However he and his wife feel that his anxiety which sometimes leads to anger is not adequately managed.    The goal is to better control his anxiety but there is still is a question of whether  he has bipolar tendencies in which case increase in the sertraline could make things worse.  He has not read much about bipolar disorder.  The primary diagnosis remains somewhat in question as to whether it is intermittent explosive disorder, a variant of bipolar disorder, OCD or PTSD.  Continue oxcarbazepine to 1 tablet in the morning and 2 in evening this has the potential to help anxiety and irritability also and does not have a risk of making him more hyper.  He did not notice any particular  benefit with this increase but we could consider going higher if need be and it has a low sexual side effect risk.  Continue sertraline 100 mg in the morning.  He clearly saw additional benefit with the increase in sertraline and that he is less ruminative on his anger.  He is able to let things go quicker.  However he does have very significant sexual side effects.  He is willing to tolerate that for now.  Sertraline does more for him than any other med he's taking.  Would consider increasing it today except for the sexual side effects.  No problems off of lamotrigine.  Option switch to fluvoxamine to reduce sexual SE.  The mirtazapine is effective for his sleep at one half of a 15 mg tablet so we will not change that.  No med changes today.  We will give it 6-8 more weeks to get the optimal response from sertraline and in hopes that the sexual side effects will gradually get better.  Continue counseling as needed he is.  He is found that helpful  This was a 30-minute appointment  Follow-up 6-8 weeks  Lynder Parents, MD, DFAPA    Future Appointments  Date Time Provider [place]  04/26/2019  9:00 AM May, [place], [facility] CP-CP None  05/10/2019  9:00 AM May, [place], [facility] CP-CP None  05/24/2019  9:00 AM May, [place], [facility] CP-CP None  06/04/2019  9:00 AM [place]le, [place]., MD CP-CP None    No orders of the defined types were placed in this encounter.   -------------------------------

## 2019-04-26 ENCOUNTER — Ambulatory Visit (INDEPENDENT_AMBULATORY_CARE_PROVIDER_SITE_OTHER): Payer: Self-pay | Admitting: Psychiatry

## 2019-04-26 ENCOUNTER — Other Ambulatory Visit: Payer: Self-pay

## 2019-04-26 ENCOUNTER — Encounter: Payer: Self-pay | Admitting: Psychiatry

## 2019-04-26 DIAGNOSIS — F411 Generalized anxiety disorder: Secondary | ICD-10-CM

## 2019-04-26 NOTE — Progress Notes (Signed)
      [facility] Counselor/Therapist Progress Note  Patient ID: Jose [name], MRN: [id number],    Date: 04/26/2019  Time Spent: 50 minutes   Treatment Type: Individual Therapy  Reported Symptoms: anxious  Mental Status Exam:  Appearance:   Casual     Behavior:  Appropriate  Motor:  Normal  Speech/Language:   Clear and Coherent  Affect:  Appropriate  Mood:  anxious  Thought process:  normal  Thought content:    WNL  Sensory/Perceptual disturbances:    WNL  Orientation:  oriented to person, place, time/date and situation  Attention:  Good  Concentration:  Good  Memory:  WNL  Fund of knowledge:   Good  Insight:    Good  Judgment:   Good  Impulse Control:  Good   Risk Assessment: Danger to Self:  No Self-injurious Behavior: No Danger to Others: No Duty to Warn:no Physical Aggression / Violence:No  Access to Firearms a concern: No  Gang Involvement:No   Subjective: The client's wife with his permission sent me a daily diary since his last appointment.  For the most part the client has had fairly positive days.  There were a few days where he had employee difficulties that his anxiety and irritability were increased.  I spoke to the client about that circumstance.  He reported that his father's brother, the client's uncle, works for him as a Librarian, academic at [employer].  His uncle stated that he could make more money on his own versus working for the client.  The client states he is fine with that because in actuality he would like to take that position back over for himself.  His uncle stated he would give him his decision over the weekend. The client wanted to give me a little more history about his family of origin.  His own father was the son of the client's grandfather's first wife.  When his grandfather divorced his father was sent to an orphanage in [place].  He was raised there until about age 5.  He then moved to [facility] and was raised by his mother and maternal  grandmother.  The client's grandfather went on to have 2 more marriages.  His second marriage produced 2 children 1 of which is the uncle that works for the client.  Their mother died in their grandfather remarried a third time. The client discussed how well regarded his grandfather was within the county.  He was the sheriff for around 20 years.  "He ran a tight ship."  The client also noted some similarities between himself and his father.  He has a better understanding of why his father is the way he is.  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  That statement choked up the client.  He said, "I started to get emotional but I reeled it back in."  The client is afraid of deep emotional expression.  The hope is is that we will be able to use eye-movement at the next session to help reduce the clients overall anxiety and tension.   Interventions: Motivational Interviewing, Solution-Oriented/Positive Psychology and Insight-Oriented  Diagnosis:   ICD-10-CM   1. Generalized anxiety disorder  F41.1     Plan: Daily journal, self-care, exercise, positive self talk.  Sincerity Cedar, [facility]

## 2019-05-10 ENCOUNTER — Ambulatory Visit (INDEPENDENT_AMBULATORY_CARE_PROVIDER_SITE_OTHER): Payer: Self-pay | Admitting: Psychiatry

## 2019-05-10 ENCOUNTER — Encounter: Payer: Self-pay | Admitting: Psychiatry

## 2019-05-10 ENCOUNTER — Other Ambulatory Visit: Payer: Self-pay

## 2019-05-10 DIAGNOSIS — F411 Generalized anxiety disorder: Secondary | ICD-10-CM

## 2019-05-10 NOTE — Progress Notes (Signed)
      [facility] Counselor/Therapist Progress Note  Patient ID: Jose [name], MRN: [id number],    Date: 05/10/2019  Time Spent: 50 minutes   Treatment Type: Individual Therapy  Reported Symptoms: anxious  Mental Status Exam:  Appearance:   Casual     Behavior:  Appropriate  Motor:  Normal  Speech/Language:   Clear and Coherent  Affect:  Appropriate  Mood:  anxious  Thought process:  normal  Thought content:    WNL  Sensory/Perceptual disturbances:    WNL  Orientation:  oriented to person, place, time/date and situation  Attention:  Good  Concentration:  Good  Memory:  WNL  Fund of knowledge:   Good  Insight:    Good  Judgment:   Good  Impulse Control:  Good   Risk Assessment: Danger to Self:  No Self-injurious Behavior: No Danger to Others: No Duty to Warn:no Physical Aggression / Violence:No  Access to Firearms a concern: No  Gang Involvement:No   Subjective: The client has been very busy on his property.  He finds that when he stays engaged that his anxiety is much lower.  Since his last appointment he has built a hard scape patio for a fireplace he plans to build.  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  He states after October 3 he is going to take time off to take care of himself. The client states he has done well in his relationship with his wife.  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  He discussed in detail more of his family of origin.  I asked the client why his grandfather had engaged his other grandchildren but not him.  The client talked about the dynamics with his grandfather.  His grandfather barely engaged his own son the client's dad.  He described a fairly dysfunctional system where people were disconnected from each other probably due to the economic disparities and the social constructs of the time.  He describes 1 part of his family as being  "peculiar people".  The behaviors he described indicate possible OCD and definitely anxiety.  The client struggles with his own anxiety and at times deeply ruminates over things.  He finds that activity and exercise help reduce this quite a bit. He is currently at the same level of sertraline as last session, 100 mg.  He will see [name] within the next month to determine if they need to increase his meds.  He still notes that the medication has knocked his libido out but he does not notice that it is coming back a tiny bit.  He will discuss this in more detail with [name].  Interventions: Motivational Interviewing, Solution-Oriented/Positive Psychology and Insight-Oriented  Diagnosis:   ICD-10-CM   1. Generalized anxiety disorder  F41.1     Plan: Positive self talk, engaged activities, social network, exercise, self-care.  Devinn Voshell, [facility]

## 2019-05-24 ENCOUNTER — Other Ambulatory Visit: Payer: Self-pay

## 2019-05-24 ENCOUNTER — Ambulatory Visit (INDEPENDENT_AMBULATORY_CARE_PROVIDER_SITE_OTHER): Payer: Self-pay | Admitting: Psychiatry

## 2019-05-24 ENCOUNTER — Encounter: Payer: Self-pay | Admitting: Psychiatry

## 2019-05-24 DIAGNOSIS — F411 Generalized anxiety disorder: Secondary | ICD-10-CM
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
## 2019-06-04 ENCOUNTER — Other Ambulatory Visit: Payer: Self-pay

## 2019-06-04 ENCOUNTER — Encounter: Payer: Self-pay | Admitting: Psychiatry

## 2019-06-04 ENCOUNTER — Ambulatory Visit (INDEPENDENT_AMBULATORY_CARE_PROVIDER_SITE_OTHER): Payer: Self-pay | Admitting: Psychiatry

## 2019-06-04 DIAGNOSIS — R454 Irritability and anger: Secondary | ICD-10-CM

## 2019-06-04 DIAGNOSIS — F39 Unspecified mood [affective] disorder: Secondary | ICD-10-CM

## 2019-06-04 DIAGNOSIS — F5105 Insomnia due to other mental disorder: Secondary | ICD-10-CM

## 2019-06-04 DIAGNOSIS — F411 Generalized anxiety disorder: Secondary | ICD-10-CM

## 2019-06-04 NOTE — Progress Notes (Signed)
Jose [name] [id number] 1971/07/30 48 y.o.  Subjective:   Patient ID:  Jose [name] is a 48 y.o. (DOB 04/22/1971) male.  Chief Complaint:  Chief Complaint  Patient presents with  . Follow-up    HPI Jose [name] presents to the office today for follow-up of anger, irritability and anxiety.  At visit in April 23.  Started sertraline 50 mg and reduced lamotrigine to 100 mg daily.  Still irritable but better control of the anger.    When seen in June 2020.  For continued symptoms oxcarbazepine was increased to 900 mg daily.  He was weaned off lamotrigine because it was not felt to be very helpful.  He continued mirtazapine for sleep.  He continued low-dose sertraline for anxiety because Initially sertraline at HS caused insomnia and wired him. Up.    Last seen April 24, 2019.  No meds were changed.  Has cont counseling.  Wife said leave everything alone and doing well.  Still problems with low sex drive.  Still doesn't really want  Him to change but ambivalent.  Can still get anger but handles it better and lets it go easier.    Since then called and increased the sertraline to 100 mg daily and it helped.  Change to 100 mg on July 1.  Can let go of anger quicker.  Wife noticed I don't hold onto it.   still get mad but handling it better.  Been angry all his life.  Grew up in a bad situation with bipolar, schizophrenic with hospitalizations as a kid.  Has a farm with equipment.  Anxiety is high bc involved in a project.  Still jump on people including father and sister yesterday.  I've done well for myself and I tolerate very little.  But is generous.   Probably way worse than he really lets on.  I got issues.  No sex drive now with sertraline.  Patient reports stable mood and denies depressed but is irritable moods.  He has struggled with anger all his life and does not believe that we will ever fully resolved.  Patient denies difficulty with sleep initiation or maintenance. Sleep  like a baby.  Denies appetite disturbance.  Patient reports that energy and motivation have been good.  Patient denies any difficulty with concentration.  Patient denies any suicidal ideation.  Stress will be better after the weekend..  Again disc possible manic history.  Had trouble sleeping until mirtazapine.  On [place] obsessive-compulsive inventory he answered yes to obsessive thoughts 1 through 5, no to 6 and 7 yes to 7 and 8, noted #10 yes to 1112 and 13, no 214 1516, yes to 17, no to 1819, yesterday #20.  On part B he answered to his extreme to questions 124 and 5 and little control of #3.  Past Psychiatric Medication Trials: Depakote, lamotrigine, Equetro, Sertraline 50 ,  Trileptal 300 BID,  Latuda 20  Review of Systems:  Review of Systems  Neurological: Negative for tremors, weakness and headaches.    Medications: I have reviewed the patient's current medications.  Current Outpatient Medications  Medication Sig Dispense Refill  . amLODipine (NORVASC) 5 MG tablet 5 mg.    . esomeprazole (NEXIUM) 20 MG capsule Take 20 mg by mouth daily.     . fexofenadine (ALLEGRA) 180 MG tablet Take 180 mg by mouth daily as needed for allergies.     Marland Kitchen HYDROCHLOROTHIAZIDE PO Take 25 mg by mouth daily.     Marland Kitchen losartan (  COZAAR) 100 MG tablet Take 100 mg by mouth daily.     . mirtazapine (REMERON) 15 MG tablet Take 0.5-1 tablets (7.5-15 mg total) by mouth at bedtime. 90 tablet 1  . Oxcarbazepine (TRILEPTAL) 300 MG tablet TAKE 1 TABLET (300 MG TOTAL) BY MOUTH 2 (TWO) TIMES DAILY. 180 tablet 0  . sertraline (ZOLOFT) 100 MG tablet TAKE 1 TABLET DAILY 90 tablet 1  . tamsulosin (FLOMAX) 0.4 MG CAPS capsule 0.4 mg.     No current facility-administered medications for this visit.     Medication Side Effects:  Marked sexual SE.  Looser BM   Allergies:  Allergies  Allergen Reactions  . Shellfish Allergy Anaphylaxis  . Penicillins     Unknown, childhood reaction Has patient had a PCN reaction  causing immediate rash, facial/tongue/throat swelling, SOB or lightheadedness with hypotension: Unknown Has patient had a PCN reaction causing severe rash involving mucus membranes or skin necrosis: Unknown Has patient had a PCN reaction that required hospitalization No Has patient had a PCN reaction occurring within the last 10 years: No If all of the above answers are "NO", then may proceed with Cephalosporin use.     Past Medical History:  Diagnosis Date  . Diverticulitis   . GERD (gastroesophageal reflux disease)   . Hypertension   . Seasonal allergies     Family History  Problem Relation Age of Onset  . Heart disease Mother   . Diabetes Mother   . Cancer Father        prostate  . Heart disease Unknown   . Cancer Unknown     Social History   Socioeconomic History  . Marital status: Married    Spouse name: Jose [name]  . Number of children: 2  . Years of education: 30  . Highest education level: Not on file  Occupational History    Employer: [employer]    Comment: [employer]  Social Needs  . Financial resource strain: Not on file  . Food insecurity    Worry: Not on file    Inability: Not on file  . Transportation needs    Medical: Not on file    Non-medical: Not on file  Tobacco Use  . Smoking status: Never Smoker  . Smokeless tobacco: Never Used  Substance and Sexual Activity  . Alcohol use: No  . Drug use: No  . Sexual activity: Not on file  Lifestyle  . Physical activity    Days per week: Not on file    Minutes per session: Not on file  . Stress: Not on file  Relationships  . Social Herbalist on phone: Not on file    Gets together: Not on file    Attends religious service: Not on file    Active member of club or organization: Not on file    Attends meetings of clubs or organizations: Not on file    Relationship status: Not on file  . Intimate partner violence    Fear of current or ex partner: Not on file    Emotionally abused: Not on file     Physically abused: Not on file    Forced sexual activity: Not on file  Other Topics Concern  . Not on file  Social History Narrative  . Not on file    Past Medical History, Surgical history, Social history, and Family history were reviewed and updated as appropriate.   Please see review of systems for further details on the patient's review  from today.   Objective:   Physical Exam:  There were no vitals taken for this visit.  Physical Exam Constitutional:      General: He is not in acute distress.    Appearance: He is well-developed. He is obese.  Musculoskeletal:        General: No deformity.  Neurological:     Mental Status: He is alert and oriented to person, place, and time.     Coordination: Coordination normal.  Psychiatric:        Attention and Perception: Attention normal. He is attentive.        Mood and Affect: Mood is anxious. Mood is not depressed. Affect is not labile, blunt, angry or inappropriate.        Speech: Speech normal.        Behavior: Behavior normal. Behavior is not agitated or aggressive.        Thought Content: Thought content normal. Thought content does not include homicidal or suicidal ideation. Thought content does not include homicidal or suicidal plan.        Cognition and Memory: Cognition normal.        Judgment: Judgment normal.     Comments: Insight is fair.  He still has irritability but was certainly appropriate in the office. His affect is fairly intense but not manic.     Lab Review:     Component Value Date/Time   NA 144 03/12/2012 1100   K 3.6 03/12/2012 1100   CL 104 03/12/2012 1100   CO2 29 03/12/2012 1100   GLUCOSE 108 (H) 03/12/2012 1100   BUN 15 03/12/2012 1100   CREATININE 0.83 03/12/2012 1100   CALCIUM 10.5 03/12/2012 1100   PROT 6.4 10/13/2010 2037   ALBUMIN 4.7 10/13/2010 2037   AST 16 10/13/2010 2037   ALT 19 10/13/2010 2037   ALKPHOS 57 10/13/2010 2037   BILITOT 1.4 (H) 10/13/2010 2037   GFRNONAA >90  03/12/2012 1100   GFRAA >90 03/12/2012 1100       Component Value Date/Time   WBC 10.0 09/27/2010 1417   RBC 5.24 09/27/2010 1417   HGB 16.4 03/12/2012 1100   HCT 45.6 03/12/2012 1100   PLT 218 09/27/2010 1417   MCV 84.2 09/27/2010 1417   MCH 30.2 09/27/2010 1417   MCHC 35.8 09/27/2010 1417   RDW 12.9 09/27/2010 1417   LYMPHSABS 2.9 09/27/2010 1417   MONOABS 0.5 09/27/2010 1417   EOSABS 0.3 09/27/2010 1417   BASOSABS 0.1 09/27/2010 1417    No results found for: POCLITH, LITHIUM   No results found for: PHENYTOIN, PHENOBARB, VALPROATE, CBMZ   .res Assessment: Plan:    Ashtyn was seen today for follow-up.  Diagnoses and all orders for this visit:  Episodic mood disorder ([place])  Anger  Generalized anxiety disorder  Insomnia due to mental condition     History of bipolar disorder versus major depression Rule out intermittent explosive disorder Rule out OCD variant but it seems to just focus on perfectionism.  Greater than 50% of face to face time with patient was spent on counseling and coordination of care. We discussed Patient describes history of perfectionism and emotional intensity that sometimes leads to outbursts of anger.  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  Has lost friendships over anger outbursts.  Has worked on it in therapy. Unclear about a  distinct pattern of cyclic mania but the meds that have been used are meds that are often used to treat  anger outbursts effectively.   However he and his wife feel that his anxiety which sometimes leads to anger is not adequately managed.    The goal is to better control his anxiety but there is still is a question of whether he has bipolar tendencies in which case increase in the sertraline could make things worse.  He has not read much about bipolar disorder.  The primary diagnosis remains somewhat in question as to whether it is intermittent explosive disorder, a variant of bipolar disorder, OCD or  PTSD.  Continue oxcarbazepine to 1 tablet in the morning and 2 in evening this has the potential to help anxiety and irritability also and does not have a risk of making him more hyper.  He did not notice any particular benefit with this increase but we could consider going higher if need be and it has a low sexual side effect risk.  Continue sertraline 100 mg in the morning vs reduce to 75 vs change to fluvoxamine.  He clearly saw additional benefit with the increase in sertraline and that he is less ruminative on his anger.  He is able to let things go quicker.  However he does have very significant sexual side effects.  He is willing to tolerate that for now.  Sertraline does more for him than any other med he's taking. So relieved to be in control of his anger and doesn't want to change it.  Wife pleased with the response and doesn't want him to change anything.  No problems off of lamotrigine.  Option switch to fluvoxamine to reduce sexual SE.  The mirtazapine is effective for his sleep at one half of a 15 mg tablet so we will not change that.  No med changes today.  We will give it 6-8 more weeks to get the optimal response from sertraline and in hopes that the sexual side effects will gradually get better.  Continue counseling as needed he is.  He is found that helpful  This was a 30-minute appointment  Follow-up 6-8 weeks  Lynder Parents, MD, DFAPA    Future Appointments  Date Time Provider [place]  06/14/2019 10:00 AM May, [place], [facility]-CP None  07/02/2019  9:00 AM May, [place], [facility] CP-CP None    No orders of the defined types were placed in this encounter.   -------------------------------

## 2019-06-14 ENCOUNTER — Other Ambulatory Visit: Payer: Self-pay

## 2019-06-14 ENCOUNTER — Ambulatory Visit (INDEPENDENT_AMBULATORY_CARE_PROVIDER_SITE_OTHER): Payer: Self-pay | Admitting: Psychiatry

## 2019-06-14 ENCOUNTER — Encounter: Payer: Self-pay | Admitting: Psychiatry

## 2019-06-14 DIAGNOSIS — F411 Generalized anxiety disorder: Secondary | ICD-10-CM

## 2019-06-14 NOTE — Progress Notes (Signed)
      [facility] Counselor/Therapist Progress Note  Patient ID: Jose [name], MRN: [id number],    Date: 06/14/2019  Time Spent: 50 minutes   Treatment Type: Individual Therapy  Reported Symptoms: anxiety  Mental Status Exam:  Appearance:   Well Groomed     Behavior:  Appropriate  Motor:  Normal  Speech/Language:   Clear and Coherent  Affect:  Appropriate  Mood:  anxious  Thought process:  normal  Thought content:    WNL  Sensory/Perceptual disturbances:    WNL  Orientation:  oriented to person, place, time/date and situation  Attention:  Good  Concentration:  Good  Memory:  WNL  Fund of knowledge:   Good  Insight:    Good  Judgment:   Good  Impulse Control:  Good   Risk Assessment: Danger to Self:  No Self-injurious Behavior: No Danger to Others: No Duty to Warn:no Physical Aggression / Violence:No  Access to Firearms a concern: No  Gang Involvement:No   Subjective: The client's wife keeps a log of his mood on a daily basis.  Most recent report shows he has had consistently good days since last session.  He notes that when he comes up for his appointment with [name] or myself he ends up having something irritate him in the afternoon that makes him angry on that day.  This surprises him.  Today we focused on getting the client as aware as possible of what is going on with him so that he could intentionally have a better afternoon. The client's friend recently had the renewal of his vows on the client's farm property.  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  He stated that things went very well and his friend was very pleased.  The client is unsure as to what his next project might be.  His wife wants to move her youngest son and his family into their current house.  She would like the client to build a new house for them at the back of their 30 acre property.  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  He will use this to supply the necessary wood for his house he hopes.  We discussed staying engaged with activities that he enjoys.  Using positive self talk and prayer to help him day-to-day.  He states he and his wife prayed together twice a day which has been a big plus for him. He states the medicine has been very helpful.  He feels that he is the best he has ever been.  The downside has been how much his sex drive has been decreased.  "I hardly have any interest at all."  He does not want to change medication.  I did mention that [name] Zahli Vetsch consider adding some Wellbutrin to ameliorate the sexual side effects.  I explained that he would need to discuss that with [name] though.  The client is unsure if he wants to take another medication.  Interventions: Assertiveness/Communication, Motivational Interviewing, Solution-Oriented/Positive Psychology and Insight-Oriented  Diagnosis:   ICD-10-CM   1. Generalized anxiety disorder  F41.1     Plan: Positive self talk, engaged activity, discussed sexual side effects with [name], intentionality, mood independent behavior.  Rosealie Reach, [facility]

## 2019-06-30 ENCOUNTER — Other Ambulatory Visit: Payer: Self-pay | Admitting: Psychiatry

## 2019-07-02 ENCOUNTER — Other Ambulatory Visit: Payer: Self-pay

## 2019-07-02 ENCOUNTER — Ambulatory Visit (INDEPENDENT_AMBULATORY_CARE_PROVIDER_SITE_OTHER): Payer: Self-pay | Admitting: Psychiatry

## 2019-07-02 ENCOUNTER — Encounter: Payer: Self-pay | Admitting: Psychiatry

## 2019-07-02 DIAGNOSIS — F411 Generalized anxiety disorder: Secondary | ICD-10-CM
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
## 2019-07-25 ENCOUNTER — Encounter: Payer: Self-pay | Admitting: Psychiatry

## 2019-07-25 ENCOUNTER — Other Ambulatory Visit: Payer: Self-pay

## 2019-07-25 ENCOUNTER — Ambulatory Visit (INDEPENDENT_AMBULATORY_CARE_PROVIDER_SITE_OTHER): Payer: Self-pay | Admitting: Psychiatry

## 2019-07-25 DIAGNOSIS — F411 Generalized anxiety disorder: Secondary | ICD-10-CM

## 2019-07-25 NOTE — Progress Notes (Addendum)
      [facility] Counselor/Therapist Progress Note  Patient ID: ESMOND HINCH, MRN: [id number],    Date: 07/25/2019  Time Spent: 50 minutes   Treatment Type: Individual Therapy  Reported Symptoms: anxiety  Mental Status Exam:  Appearance:   Well Groomed     Behavior:  Appropriate  Motor:  Normal  Speech/Language:   Clear and Coherent  Affect:  Appropriate  Mood:  anxious  Thought process:  normal  Thought content:    WNL  Sensory/Perceptual disturbances:    WNL  Orientation:  oriented to person, place, time/date and situation  Attention:  Good  Concentration:  Good  Memory:  WNL  Fund of knowledge:   Good  Insight:    Good  Judgment:   Good  Impulse Control:  Good   Risk Assessment: Danger to Self:  No Self-injurious Behavior: No Danger to Others: No Duty to Warn:no Physical Aggression / Violence:No  Access to Firearms a concern: No  Gang Involvement:No   Subjective: The client states that he knows that most of his problems come from his dad.  He had recent contact with his sister when he took her to buy her funeral coverage and burial plot.  He wanted to have that done for her even though she is only 51.  He feels bad because she has struggled her whole life.  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  He finds when things started going wrong over there, even small things, he becomes anxious that then results in anger.  He realizes that he has a low frustration tolerance. The client's medicine seems to be working well for him.  He has no desire to change it.  His sexual functioning recently came back and so he is content to stay where he is. At the next session I will try to work with the client to increase his frustration tolerance.  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  He has been more successful with mood independent behavior and  self-care.  Interventions: Motivational Interviewing, Solution-Oriented/Positive Psychology and Insight-Oriented  Diagnosis:   ICD-10-CM   1. Generalized anxiety disorder  F41.1     Plan: Mood independent behavior, self-care, boundaries, positive self talk.  Korie Streat, [facility]

## 2019-08-05 ENCOUNTER — Ambulatory Visit: Payer: Self-pay | Admitting: Psychiatry

## 2019-08-15 ENCOUNTER — Encounter: Payer: Self-pay | Admitting: Psychiatry

## 2019-08-15 ENCOUNTER — Other Ambulatory Visit: Payer: Self-pay

## 2019-08-15 ENCOUNTER — Ambulatory Visit (INDEPENDENT_AMBULATORY_CARE_PROVIDER_SITE_OTHER): Payer: Self-pay | Admitting: Psychiatry

## 2019-08-15 DIAGNOSIS — F411 Generalized anxiety disorder: Secondary | ICD-10-CM

## 2019-08-15 NOTE — Progress Notes (Signed)
      [facility] Counselor/Therapist Progress Note  Patient ID: Jose [name], MRN: [id number],    Date: 08/15/2019  Time Spent: 50 minutes   Treatment Type: Individual Therapy  Reported Symptoms: anxiety  Mental Status Exam:  Appearance:   Casual     Behavior:  Appropriate  Motor:  Normal  Speech/Language:   Clear and Coherent  Affect:  Appropriate  Mood:  anxious  Thought process:  normal  Thought content:    WNL  Sensory/Perceptual disturbances:    WNL  Orientation:  oriented to person, place, time/date and situation  Attention:  Good  Concentration:  Good  Memory:  WNL  Fund of knowledge:   Good  Insight:    Good  Judgment:   Good  Impulse Control:  Good   Risk Assessment: Danger to Self:  No Self-injurious Behavior: No Danger to Others: No Duty to Warn:no Physical Aggression / Violence:No  Access to Firearms a concern: No  Gang Involvement:No   Subjective: I connected with this patient by an approved telecommunication method (audio only), with his informed consent, and verifying identity and patient privacy.  I was located at my office and patient at his home.  As needed, we discussed the limitations, risks, and security and privacy concerns associated with telehealth service, including the availability and conditions which currently govern in-person appointments and the possibility that 3rd-party payment Jose [name] not be fully guaranteed and he Jose [name] be responsible for charges.  After he indicated understanding, we proceeded with the session.  Also discussed treatment planning, as needed, including ongoing verbal agreement with the plan, the opportunity to ask and answer all questions, his demonstrated understanding of instructions, and his readiness to call the office should symptoms worsen or he feels he is in a crisis state and needs more immediate and tangible assistance. The client states that he has done fairly well since last time.  He has determined that if he gets  into a foul mood it is directly related to his work.  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  He was upset that the employees did not respond in a way that he would have done. The client states that he has lost 70 pounds since he has been at home.  His A1c has gone from 6.7-5.7.  Today the client was on telehealth because he had taken his flu shot and felt "poorly this morning".  He also had been in the doctor's office with someone in the lobby who had COVID-19.  He was practicing an abundance of caution. Client states that he is doing better with his father.  "I still have issues with control ".  This also relates back to the freezer incident at his work.  He agrees that this would be helpful event to focus on with the eye-movement desensitization and reprocessing.  The client continues to get better insight into his own behavior.    Interventions: Assertiveness/Communication, Motivational Interviewing, Solution-Oriented/Positive Psychology and Insight-Oriented  Diagnosis:   ICD-10-CM   1. Generalized anxiety disorder  F41.1     Plan: Positive self talk, mood independent behavior, assertiveness, self-care, exercise.  Jose [name], [facility]

## 2019-09-10 ENCOUNTER — Ambulatory Visit: Payer: Self-pay | Admitting: Urology

## 2019-09-18 ENCOUNTER — Ambulatory Visit (INDEPENDENT_AMBULATORY_CARE_PROVIDER_SITE_OTHER): Payer: Self-pay | Admitting: Psychiatry

## 2019-09-18 ENCOUNTER — Encounter: Payer: Self-pay | Admitting: Psychiatry

## 2019-09-18 ENCOUNTER — Other Ambulatory Visit: Payer: Self-pay

## 2019-09-18 ENCOUNTER — Ambulatory Visit: Payer: Self-pay | Admitting: Psychiatry

## 2019-09-18 DIAGNOSIS — F39 Unspecified mood [affective] disorder: Secondary | ICD-10-CM

## 2019-09-18 DIAGNOSIS — F411 Generalized anxiety disorder: Secondary | ICD-10-CM

## 2019-09-18 MED ORDER — OXCARBAZEPINE 300 MG PO TABS: 300.0000 mg | ORAL_TABLET | Freq: Two times a day (BID) | ORAL | 0 refills | Status: DC

## 2019-09-18 MED ORDER — SERTRALINE HCL 100 MG PO TABS: ORAL_TABLET | ORAL | 1 refills | Status: DC

## 2019-09-18 NOTE — Progress Notes (Signed)
      [facility] Counselor/Therapist Progress Note  Patient ID: Jose [name], MRN: [id number],    Date: 09/18/2019  Time Spent: 50 minutes   Treatment Type: Individual Therapy  Reported Symptoms: anxiety  Mental Status Exam:  Appearance:   Well Groomed     Behavior:  Appropriate  Motor:  Normal  Speech/Language:   Clear and Coherent  Affect:  Appropriate  Mood:  anxious  Thought process:  normal  Thought content:    WNL  Sensory/Perceptual disturbances:    WNL  Orientation:  oriented to person, place, time/date and situation  Attention:  Good  Concentration:  Good  Memory:  WNL  Fund of knowledge:   Good  Insight:    Good  Judgment:   Good  Impulse Control:  Good   Risk Assessment: Danger to Self:  No Self-injurious Behavior: No Danger to Others: No Duty to Warn:no Physical Aggression / Violence:No  Access to Firearms a concern: No  Gang Involvement:No   Subjective: The client states that he has done well.  The mood chart that the client's wife emailed today indicates a very good last month.  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  "Then I get elevated if I see small mistakes."  I used eye-movement with the client around this issue.  We focused on 2 of his employees.  The negative cognition is, "they are not taking it seriously.  I am responsible."  He feels anger in his chest. His subjective units of distress is a 4.  I used the eye-movement with the client.  As he processed he remembered an event when he was in his workshop.  He had a shop vac that was supposed to be pulling the sawdust from his table saw.  It did not work right.  He got frustrated with it, took it outside, and threw it against the wall.  I used the bilateral stimulation hand paddles with the client at this point.  I had him visualize himself taking the shop vac outside to throw it against  the wall.  He then invited Jesus into that picture.  When I asked him what happened he stated, "Jesus tries to fix it."  The message being, "anything can be fixed."  He noted also that his subjective units of distress dropped to less than 1.  This relates to his fear that he cannot do the job at the dairy.  Some of that is physical labor that he is unable to do because of a knee injury.  We discussed radical acceptance with the circumstances.  I asked the client what is the truth about himself that he needs to hold onto to stay calm.  He stated, "I am enough."  Interventions: Assertiveness/Communication, Motivational Interviewing, Solution-Oriented/Positive Psychology, [employer] Desensitization and Reprocessing (EMDR) and Insight-Oriented  Diagnosis:   ICD-10-CM   1. Generalized anxiety disorder  F41.1     Plan: Positive self talk, radical acceptance, mood independent behavior, assertiveness, boundaries, self-care.  Ustin Cruickshank, [facility]

## 2019-09-18 NOTE — Progress Notes (Signed)
Jose [name] [id number] 1971-04-26 49 y.o.  Subjective:   Patient ID:  Jose [name] is a 49 y.o. (DOB December 20, 1970) male.  Chief Complaint:  Chief Complaint  Patient presents with  . Follow-up    Medication Mangement  . Anxiety    Medication Mangement  . Other    OCD    HPI Jose [name] presents to the office today for follow-up of anger, irritability and anxiety.  At visit in April 23.  Started sertraline 50 mg and reduced lamotrigine to 100 mg daily.  Still irritable but better control of the anger.    When seen in June 2020.  For continued symptoms oxcarbazepine was increased to 900 mg daily.  He was weaned off lamotrigine because it was not felt to be very helpful.  He continued mirtazapine for sleep.  He continued low-dose sertraline for anxiety because Initially sertraline at HS caused insomnia and wired him. Up.    Last seen September 29 , 2020.  No meds were changed.  Leave it like it is.  Doing pretty good. Mirtazapine works for sleep.  No med changes since here.  Has cont counseling.  Wife said leave everything alone and doing well.  Still problems with low sex drive.  Still doesn't really want  Him to change but ambivalent.  Can still get anger but handles it better and lets it go easier.     Change to 100 mg on March 06, 2019.  Can let go of anger quicker.  Still doing well with it unless provoked at work.  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   Wife noticed I don't hold onto it.   still get mad but handling it better.  Been angry all his life.  Grew up in a bad situation with bipolar, schizophrenic with hospitalizations as a kid.  Has a farm with equipment.  Anxiety is high bc involved in a project.  Still jump on people including father and sister yesterday.  I've done well for myself and I tolerate very little.  But is generous.   Probably way worse than he really lets on.  I got issues.  sex drive now better with  sertraline.  Patient reports stable mood and denies depressed but is irritable moods.  He has struggled with anger all his life and does not believe that we will ever fully resolved.  Patient denies difficulty with sleep initiation or maintenance. Sleep like a baby.  Denies appetite disturbance.  Patient reports that energy and motivation have been good.  Patient denies any difficulty with concentration.  Patient denies any suicidal ideation.  On [place] obsessive-compulsive inventory he answered yes to obsessive thoughts 1 through 5, no to 6 and 7 yes to 7 and 8, noted #10 yes to 1112 and 13, no 214 1516, yes to 17, no to 1819, yesterday #20.  On part B he answered to his extreme to questions 124 and 5 and little control of #3.  Past Psychiatric Medication Trials: Depakote, lamotrigine, Equetro,  Trileptal 300 BID,  Latuda 20, Sertraline 100 ,   fluvoxamine  Review of Systems:  Review of Systems  Neurological: Negative for tremors, weakness and headaches.    Medications: I have reviewed the patient's current medications.  Current Outpatient Medications  Medication Sig Dispense Refill  . amLODipine (NORVASC) 5 MG tablet 5 mg.    . esomeprazole (NEXIUM) 20 MG capsule Take 20 mg by mouth daily.     Marland Kitchen  fexofenadine (ALLEGRA) 180 MG tablet Take 180 mg by mouth daily as needed for allergies.     Marland Kitchen HYDROCHLOROTHIAZIDE PO Take 25 mg by mouth daily.     Marland Kitchen losartan (COZAAR) 100 MG tablet Take 100 mg by mouth daily.     . mirtazapine (REMERON) 15 MG tablet Take 0.5-1 tablets (7.5-15 mg total) by mouth at bedtime. 90 tablet 1  . Oxcarbazepine (TRILEPTAL) 300 MG tablet Take 1 tablet (300 mg total) by mouth 2 (two) times daily. 180 tablet 0  . sertraline (ZOLOFT) 100 MG tablet TAKE 1 TABLET DAILY 90 tablet 1  . tamsulosin (FLOMAX) 0.4 MG CAPS capsule 0.4 mg.     No current facility-administered medications for this visit.    Medication Side Effects:  Marked sexual SE.  Looser BM   Allergies:   Allergies  Allergen Reactions  . Shellfish Allergy Anaphylaxis  . Penicillins     Unknown, childhood reaction Has patient had a PCN reaction causing immediate rash, facial/tongue/throat swelling, SOB or lightheadedness with hypotension: Unknown Has patient had a PCN reaction causing severe rash involving mucus membranes or skin necrosis: Unknown Has patient had a PCN reaction that required hospitalization No Has patient had a PCN reaction occurring within the last 10 years: No If all of the above answers are "NO", then may proceed with Cephalosporin use.     Past Medical History:  Diagnosis Date  . Diverticulitis   . GERD (gastroesophageal reflux disease)   . Hypertension   . Seasonal allergies     Family History  Problem Relation Age of Onset  . Heart disease Mother   . Diabetes Mother   . Cancer Father        prostate  . Heart disease Unknown   . Cancer Unknown     Social History   Socioeconomic History  . Marital status: Married    Spouse name: Jose [name]  . Number of children: 2  . Years of education: 78  . Highest education level: Not on file  Occupational History    Employer: [employer]    Comment: [employer]  Tobacco Use  . Smoking status: Never Smoker  . Smokeless tobacco: Never Used  Substance and Sexual Activity  . Alcohol use: No  . Drug use: No  . Sexual activity: Not on file  Other Topics Concern  . Not on file  Social History Narrative  . Not on file   Social Determinants of Health   Financial Resource Strain:   . Difficulty of Paying Living Expenses: Not on file  Food Insecurity:   . Worried About Charity fundraiser in the Last Year: Not on file  . Ran Out of Food in the Last Year: Not on file  Transportation Needs:   . Lack of Transportation (Medical): Not on file  . Lack of Transportation (Non-Medical): Not on file  Physical Activity:   . Days of Exercise per Week: Not on file  . Minutes of Exercise per Session: Not on file  Stress:    . Feeling of Stress : Not on file  Social Connections:   . Frequency of Communication with Friends and Family: Not on file  . Frequency of Social Gatherings with Friends and Family: Not on file  . Attends Religious Services: Not on file  . Active Member of Clubs or Organizations: Not on file  . Attends Archivist Meetings: Not on file  . Marital Status: Not on file  Intimate Partner Violence:   .  Fear of Current or Ex-Partner: Not on file  . Emotionally Abused: Not on file  . Physically Abused: Not on file  . Sexually Abused: Not on file    Past Medical History, Surgical history, Social history, and Family history were reviewed and updated as appropriate.   Please see review of systems for further details on the patient's review from today.   Objective:   Physical Exam:  There were no vitals taken for this visit.  Physical Exam Constitutional:      General: He is not in acute distress.    Appearance: He is well-developed. He is obese.  Musculoskeletal:        General: No deformity.  Neurological:     Mental Status: He is alert and oriented to person, place, and time.     Coordination: Coordination normal.  Psychiatric:        Attention and Perception: Attention normal. He is attentive.        Mood and Affect: Mood is anxious. Mood is not depressed. Affect is not labile, blunt, angry or inappropriate.        Speech: Speech normal.        Behavior: Behavior normal. Behavior is not agitated or aggressive.        Thought Content: Thought content normal. Thought content does not include homicidal or suicidal ideation. Thought content does not include homicidal or suicidal plan.        Cognition and Memory: Cognition normal.        Judgment: Judgment normal.     Comments: Insight is fair.  He still has irritability but was certainly appropriate in the office.      Lab Review:     Component Value Date/Time   NA 144 03/12/2012 1100   K 3.6 03/12/2012 1100   CL  104 03/12/2012 1100   CO2 29 03/12/2012 1100   GLUCOSE 108 (H) 03/12/2012 1100   BUN 15 03/12/2012 1100   CREATININE 0.83 03/12/2012 1100   CALCIUM 10.5 03/12/2012 1100   PROT 6.4 10/13/2010 2037   ALBUMIN 4.7 10/13/2010 2037   AST 16 10/13/2010 2037   ALT 19 10/13/2010 2037   ALKPHOS 57 10/13/2010 2037   BILITOT 1.4 (H) 10/13/2010 2037   GFRNONAA >90 03/12/2012 1100   GFRAA >90 03/12/2012 1100       Component Value Date/Time   WBC 10.0 09/27/2010 1417   RBC 5.24 09/27/2010 1417   HGB 16.4 03/12/2012 1100   HCT 45.6 03/12/2012 1100   PLT 218 09/27/2010 1417   MCV 84.2 09/27/2010 1417   MCH 30.2 09/27/2010 1417   MCHC 35.8 09/27/2010 1417   RDW 12.9 09/27/2010 1417   LYMPHSABS 2.9 09/27/2010 1417   MONOABS 0.5 09/27/2010 1417   EOSABS 0.3 09/27/2010 1417   BASOSABS 0.1 09/27/2010 1417    No results found for: POCLITH, LITHIUM   No results found for: PHENYTOIN, PHENOBARB, VALPROATE, CBMZ   .res Assessment: Plan:    Jose [name] was seen today for follow-up, anxiety and other.  Diagnoses and all orders for this visit:  Episodic mood disorder (HCC) -     Oxcarbazepine (TRILEPTAL) 300 MG tablet; Take 1 tablet (300 mg total) by mouth 2 (two) times daily.  Generalized anxiety disorder -     sertraline (ZOLOFT) 100 MG tablet; TAKE 1 TABLET DAILY     History of bipolar disorder versus major depression Rule out intermittent explosive disorder Rule out OCD variant but it seems to just focus on perfectionism.  Greater than 50% of face to face time with patient was spent on counseling and coordination of care. We discussed Patient describes history of perfectionism and emotional intensity that sometimes leads to outbursts of anger.  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  Has lost friendships over anger outbursts.  Has worked on it in therapy. Unclear about a  distinct pattern of cyclic mania but the meds that have been used are meds that are often used to  treat anger outbursts effectively.   However he and his wife feel that his anxiety which sometimes leads to anger is now adequately managed.     Continue oxcarbazepine to 1 tablet in the morning and 2 in evening this has the potential to help anxiety and irritability also and does not have a risk of making him more hyper.  He did not notice any particular benefit with this increase but we could consider going higher if need be and it has a low sexual side effect risk.  Continue sertraline 100 mg daily.  He clearly saw additional benefit with the increase in sertraline and that he is less ruminative on his anger.  He is able to let things go quicker.  Sexual side effects have resolved sertraline does more for him than any other med he's taking. So relieved to be in control of his anger and doesn't want to change it.  Wife pleased with the response and doesn't want him to change anything.  Recent PE unremarkable.  The mirtazapine is effective for his sleep at one half of a 15 mg tablet so we will not change that.  No med changes today.   Continue counseling as needed he is.  He is found that helpful  Follow-up 6 mos  Lynder Parents, MD, DFAP[facility]    Future Appointments  Date Time Provider [place]  10/15/2019  9:30 AM Franchot Gallo, MD [facility] None    No orders of the defined types were placed in this encounter.   -------------------------------

## 2019-09-24 ENCOUNTER — Other Ambulatory Visit: Payer: Self-pay | Admitting: Psychiatry

## 2019-10-14 DIAGNOSIS — D235 Other benign neoplasm of skin of trunk: Secondary | ICD-10-CM | POA: Diagnosis not present

## 2019-10-14 DIAGNOSIS — L570 Actinic keratosis: Secondary | ICD-10-CM | POA: Diagnosis not present

## 2019-10-14 DIAGNOSIS — D1801 Hemangioma of skin and subcutaneous tissue: Secondary | ICD-10-CM | POA: Diagnosis not present

## 2019-10-15 ENCOUNTER — Encounter: Payer: Self-pay | Admitting: Urology

## 2019-10-15 ENCOUNTER — Ambulatory Visit (INDEPENDENT_AMBULATORY_CARE_PROVIDER_SITE_OTHER): Payer: BC Managed Care – PPO | Admitting: Urology

## 2019-10-15 ENCOUNTER — Other Ambulatory Visit: Payer: Self-pay

## 2019-10-15 VITALS — BP 143/75 | HR 90 | Temp 97.0°F | Ht 75.0 in | Wt 285.0 lb

## 2019-10-15 DIAGNOSIS — N400 Enlarged prostate without lower urinary tract symptoms: Secondary | ICD-10-CM

## 2019-10-15 LAB — POCT URINALYSIS DIPSTICK
Bilirubin, UA: NEGATIVE
Blood, UA: NEGATIVE
Glucose, UA: NEGATIVE
Leukocytes, UA: NEGATIVE
Nitrite, UA: NEGATIVE
Protein, UA: POSITIVE — AB
Spec Grav, UA: 1.02 (ref 1.010–1.025)
Urobilinogen, UA: 0.2 E.U./dL
pH, UA: 5 (ref 5.0–8.0)

## 2019-10-15 MED ORDER — TAMSULOSIN HCL 0.4 MG PO CAPS: 0.4000 mg | ORAL_CAPSULE | Freq: Two times a day (BID) | ORAL | 3 refills | Status: DC

## 2019-10-15 NOTE — Patient Instructions (Signed)
Try decreasing tamsulosin over the next few weeks

## 2019-10-15 NOTE — Progress Notes (Signed)
H&P  Chief Complaint: BPH w/ LUTS  History of Present Illness:   2.9.2021: Here today for follow-up reporting significantly improved urinary sx's on 2x tamsulosin daily. He also reports having been a lot more physically active lately and having lost a significant amount of weight -- he attributes his improved urinary sx's to his medication as well as this weight loss. He also notes at today's visit that his father has been treated aggressively for prostate cancer, requiring radical prostatectomy and radiation. He states that this has made him somewhat paranoid about his own risk for PCa. He denies any significant side effects from tamsulosin though does note intermittent retrograde ejaculation (1/10 times), though he is also on zoloft and thinks its side effects may be exacerbating this.   IPSS Questionnaire (AUA-7): Over the past month.   1)  How often have you had a sensation of not emptying your bladder completely after you finish urinating?  0 - Not at all  2)  How often have you had to urinate again less than two hours after you finished urinating? 0 - Not at all  3)  How often have you found you stopped and started again several times when you urinated?  0 - Not at all  4) How difficult have you found it to postpone urination?  0 - Not at all  5) How often have you had a weak urinary stream?  1 - Less than 1 time in 5  6) How often have you had to push or strain to begin urination?  0 - Not at all  7) How many times did you most typically get up to urinate from the time you went to bed until the time you got up in the morning?  0 - None  Total score:  0-7 mildly symptomatic   8-19 moderately symptomatic   20-35 severely symptomatic  IPSS: 1 QoL: 1  (below copied from [facility] records):  BPH:  Jose [name] is a 49 year-old male established patient who is here for symptoms of enlarged prostate.  His symptoms have gotten worse over the last year. He has been treated with Flomax. The  patient has never had a surgical procedure for bladder outlet obstruction to his prostate.   10.22.2019: 49 year old male referred by Dr. Rory Percy for evaluation and management of lower urinary tract issues. He does have obstructive symptomatology, with some improvement on tamsulosin which was started a few weeks ago. However, his symptoms have gotten a bit worse recently. There is a family history of prostate cancer. PSA recently was 0.6. He was started on bid dosing of Flomax.   12.3.2019: He returns for follow up. IPSS 10, quality of life 1. No significant side effects from the increased dose of tamsulosin.   Past Medical History:  Diagnosis Date  . Diverticulitis   . GERD (gastroesophageal reflux disease)   . Hypertension   . Seasonal allergies     Past Surgical History:  Procedure Laterality Date  . CHOLECYSTECTOMY  04/2009  . CHOLECYSTECTOMY    . COLONOSCOPY     by Dr. Kem Boroughs polyps  . ESOPHAGOGASTRODUODENOSCOPY  05/2009   Schatzki's ring, s/p dilatation + eosinophilic  . KNEE ARTHROSCOPY  03/16/2012   Procedure: ARTHROSCOPY KNEE;  Surgeon: Carole Civil, MD;  Location: [facility];  Service: Orthopedics;  Laterality: Left;  drilling and abrasion, arthroplasty of trochlea  . KNEE SURGERY     left  . UMBILICAL HERNIA REPAIR      Home Medications:  Allergies as of 10/15/2019      Reactions   Shellfish Allergy Anaphylaxis   Penicillins    Unknown, childhood reaction Has patient had a PCN reaction causing immediate rash, facial/tongue/throat swelling, SOB or lightheadedness with hypotension: Unknown Has patient had a PCN reaction causing severe rash involving mucus membranes or skin necrosis: Unknown Has patient had a PCN reaction that required hospitalization No Has patient had a PCN reaction occurring within the last 10 years: No If all of the above answers are "NO", then may proceed with Cephalosporin use.      Medication List       Accurate as of October 15, 2019   9:56 AM. If you have any questions, ask your nurse or doctor.        amLODipine 5 MG tablet Commonly known as: NORVASC 5 mg.   esomeprazole 20 MG capsule Commonly known as: NEXIUM Take 20 mg by mouth daily.   fexofenadine 180 MG tablet Commonly known as: ALLEGRA Take 180 mg by mouth daily as needed for allergies.   Fish Oil 1000 MG Caps Take by mouth.   HYDROCHLOROTHIAZIDE PO Take 25 mg by mouth daily. What changed: Another medication with the same name was removed. Continue taking this medication, and follow the directions you see here. Changed by: Jose Loa, MD   losartan 100 MG tablet Commonly known as: COZAAR Take 100 mg by mouth daily.   mirtazapine 15 MG tablet Commonly known as: REMERON TAKE 0.5-1 TABLETS (7.5-15 MG TOTAL) BY MOUTH AT BEDTIME.   Oxcarbazepine 300 MG tablet Commonly known as: TRILEPTAL Take 1 tablet (300 mg total) by mouth 2 (two) times daily.   sertraline 100 MG tablet Commonly known as: ZOLOFT TAKE 1 TABLET DAILY   tamsulosin 0.4 MG Caps capsule Commonly known as: FLOMAX 0.4 mg.   vitamin C 100 MG tablet Take 100 mg by mouth daily.   Vitamin D3 25 MCG (1000 UT) Caps Take by mouth.       Allergies:  Allergies  Allergen Reactions  . Shellfish Allergy Anaphylaxis  . Penicillins     Unknown, childhood reaction Has patient had a PCN reaction causing immediate rash, facial/tongue/throat swelling, SOB or lightheadedness with hypotension: Unknown Has patient had a PCN reaction causing severe rash involving mucus membranes or skin necrosis: Unknown Has patient had a PCN reaction that required hospitalization No Has patient had a PCN reaction occurring within the last 10 years: No If all of the above answers are "NO", then may proceed with Cephalosporin use.     Family History  Problem Relation Age of Onset  . Heart disease Mother   . Diabetes Mother   . Cancer Father        prostate  . Heart disease Unknown   .  Cancer Unknown     Social History:  reports that he has never smoked. He has never used smokeless tobacco. He reports that he does not drink alcohol or use drugs.  ROS: A complete review of systems was performed.  All systems are negative except for pertinent findings as noted.  Physical Exam:  Vital signs in last 24 hours: BP (!) 143/75   Pulse 90   Temp (!) 97 F (36.1 C)   Ht 6' 3"  (1.905 m)   Wt 285 lb (129.3 kg)   BMI 35.62 kg/m  Constitutional:  Alert and oriented, No acute distress Cardiovascular: Regular rate  Respiratory: Normal respiratory effort GI: Abdomen is soft, nontender, nondistended, no abdominal masses.  No CVAT. No hernias. Genitourinary: Normal male phallus, testes are descended bilaterally and non-tender and without masses, scrotum is normal in appearance without lesions or masses, perineum is normal on inspection. Prostate is around 30 grams. Lymphatic: No lymphadenopathy Neurologic: Grossly intact, no focal deficits Psychiatric: Normal mood and affect  Laboratory Data:  No results for input(s): WBC, HGB, HCT, PLT in the last 72 hours.  No results for input(s): NA, K, CL, GLUCOSE, BUN, CALCIUM, CREATININE in the last 72 hours.  Invalid input(s): CO3   Results for orders placed or performed in visit on 10/15/19 (from the past 24 hour(s))  POCT urinalysis dipstick     Status: Abnormal   Collection Time: 10/15/19  9:43 AM  Result Value Ref Range   Color, UA yellow    Clarity, UA     Glucose, UA Negative Negative   Bilirubin, UA neg    Ketones, UA trace    Spec Grav, UA 1.020 1.010 - 1.025   Blood, UA neg    pH, UA 5.0 5.0 - 8.0   Protein, UA Positive (A) Negative   Urobilinogen, UA 0.2 0.2 or 1.0 E.U./dL   Nitrite, UA neg    Leukocytes, UA Negative Negative   Appearance clear    Odor     No results found for this or any previous visit (from the past 240 hour(s)).  Renal Function: No results for input(s): CREATININE in the last 168  hours. CrCl cannot be calculated (Patient's most recent lab result is older than the maximum 21 days allowed.).  Radiologic Imaging: No results found.  Impression/Assessment:  His urinary sx's are significantly improved with his weight loss and current medical regimen. Given his very minimal urinary sx's, he may be able to fully discontinue tamsulosin -- advised him to progressively taper down. Prostate exam normal -- he is having his PSA checked regularly per PCP but these records are not available to [place] currently.   Plan:  1. Advised to try tapering down to taking tamsulosin 1x daily (or even one every other day).  2. We will check his PSA today and forward results.   3. We will also get in contact with his PCP to try to get access to his other PSA data. \  4. Return in 1 yr for OV.

## 2019-10-15 NOTE — Progress Notes (Signed)
Urological Symptom Review  Patient is experiencing the following symptoms: none   Review of Systems  Gastrointestinal (upper)  : Negative for upper GI symptoms  Gastrointestinal (lower) : Negative for lower GI symptoms  Constitutional : Negative for symptoms  Skin: Negative for skin symptoms  Eyes: Negative for eye symptoms  Ear/Nose/Throat : Negative for Ear/Nose/Throat symptoms  Hematologic/Lymphatic: Negative for Hematologic/Lymphatic symptoms  Cardiovascular : Negative for cardiovascular symptoms  Respiratory : Negative for respiratory symptoms  Endocrine: Negative for endocrine symptoms  Musculoskeletal: Negative for musculoskeletal symptoms  Neurological: Negative for neurological symptoms  Psychologic: Negative for psychiatric symptoms

## 2019-10-17 ENCOUNTER — Other Ambulatory Visit: Payer: Self-pay

## 2019-10-17 ENCOUNTER — Telehealth: Payer: Self-pay

## 2019-10-17 ENCOUNTER — Ambulatory Visit (INDEPENDENT_AMBULATORY_CARE_PROVIDER_SITE_OTHER): Payer: Self-pay | Admitting: Psychiatry

## 2019-10-17 ENCOUNTER — Encounter: Payer: Self-pay | Admitting: Psychiatry

## 2019-10-17 DIAGNOSIS — F411 Generalized anxiety disorder: Secondary | ICD-10-CM

## 2019-10-17 NOTE — Telephone Encounter (Signed)
Faxed lab order to [facility] and pt. Will go there for lab work.

## 2019-10-17 NOTE — Telephone Encounter (Signed)
-----   Message from Franchot Gallo, MD sent at 10/15/2019  5:39 PM EST ----- Call pt--[facility] did not have a PSA and I have put an order in for one

## 2019-10-17 NOTE — Progress Notes (Signed)
      [facility] Counselor/Therapist Progress Note  Patient ID: Jose [name], MRN: [id number],    Date: 10/17/2019  Time Spent: 50 minutes   Treatment Type: Individual Therapy  Reported Symptoms: anxiety, stress  Mental Status Exam:  Appearance:   Well Groomed     Behavior:  Appropriate  Motor:  Normal  Speech/Language:   Clear and Coherent  Affect:  Appropriate  Mood:  anxious  Thought process:  normal  Thought content:    WNL  Sensory/Perceptual disturbances:    WNL  Orientation:  oriented to person, place, time/date and situation  Attention:  Good  Concentration:  Good  Memory:  WNL  Fund of knowledge:   Good  Insight:    Good  Judgment:   Good  Impulse Control:  Good   Risk Assessment: Danger to Self:  No Self-injurious Behavior: No Danger to Others: No Duty to Warn:no Physical Aggression / Violence:No  Access to Firearms a concern: No  Gang Involvement:No   Subjective: The client states that 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  They have made a decision that because of the stress it causes the client it would be in their best interest to sell.  The client also wants to be able to spend time with his wife now before they are retired.  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  He has been helping his father manage his affairs as his father tries to get ready for the next step with his sick wife.  The client states his father's wife has Alzheimer's and will need to be in nursing care soon. The client states he has thought a lot more about his own mother and end of life issues.  One reason he wants to spend more time with his wife is because his mother-in-law died very early on of early onset Alzheimer's.  He does not want to wait till later in life to spend time with his wife.  The client is focused on staying in the present tense.  He engages himself  with ongoing activities so that he does not perseverate on past events or and what [place] happen in the future.  He continues to have good success with the medication with mild anxiety here and there.  Interventions: Assertiveness/Communication, Mindfulness Meditation, Motivational Interviewing, Solution-Oriented/Positive Psychology and Insight-Oriented  Diagnosis:   ICD-10-CM   1. Generalized anxiety disorder  F41.1     Plan: Mindfulness, prayer, mood independent behavior, engaged activities, assertiveness, boundaries, self-care, exercise.  Byan Poplaski, [facility]

## 2019-11-20 ENCOUNTER — Ambulatory Visit (INDEPENDENT_AMBULATORY_CARE_PROVIDER_SITE_OTHER): Payer: BC Managed Care – PPO | Admitting: Psychiatry

## 2019-11-20 ENCOUNTER — Encounter: Payer: Self-pay | Admitting: Psychiatry

## 2019-11-20 ENCOUNTER — Other Ambulatory Visit: Payer: Self-pay

## 2019-11-20 DIAGNOSIS — F411 Generalized anxiety disorder: Secondary | ICD-10-CM

## 2019-11-20 NOTE — Progress Notes (Signed)
      [facility] Counselor/Therapist Progress Note  Patient ID: Jose [name], MRN: [id number],    Date: 11/20/2019  Time Spent: 45 minutes   Treatment Type: Individual Therapy  Reported Symptoms: anxiety  Mental Status Exam:  Appearance:   Well Groomed     Behavior:  Appropriate  Motor:  Normal  Speech/Language:   Clear and Coherent  Affect:  Appropriate  Mood:  anxious  Thought process:  normal  Thought content:    WNL  Sensory/Perceptual disturbances:    WNL  Orientation:  oriented to person, place, time/date and situation  Attention:  Good  Concentration:  Good  Memory:  WNL  Fund of knowledge:   Good  Insight:    Good  Judgment:   Good  Impulse Control:  Good   Risk Assessment: Danger to Self:  No Self-injurious Behavior: No Danger to Others: No Duty to Warn:no Physical Aggression / Violence:No  Access to Firearms a concern: No  Gang Involvement:No   Subjective: The client states that he is anxious today.  He has had a number of different events that have come up that have been overwhelming.  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  He got into a conflict with his father.  His nephew who has worked for him for years is leaving for another job.  He bought some new land and there is been some family conflict over that.  Client states that he is also meeting with a [place] in [place] to possibly partner with. I used eye-movement and the bilateral stimulation hand paddles with the client.  Focusing on his anxiety he feels especially around his nephew and dad.  As the client processed he felt some sadness coming up around his nephew and anger with his father.  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  We discussed how the client needs to let go of if he is being offended or not and realize that the most things are coming out of people's woundedness.  He agreed his subjective units of distress went from 8 to less than  2.  Interventions: Assertiveness/Communication, Mindfulness Meditation, Motivational Interviewing, Solution-Oriented/Positive Psychology, [employer] Desensitization and Reprocessing (EMDR) and Insight-Oriented  Diagnosis:   ICD-10-CM   1. Generalized anxiety disorder  F41.1     Plan: Positive self talk, self-care, mood independent behavior, assertiveness, boundaries.  Gerline Ratto, [facility]

## 2019-11-28 ENCOUNTER — Ambulatory Visit (INDEPENDENT_AMBULATORY_CARE_PROVIDER_SITE_OTHER): Payer: BC Managed Care – PPO | Admitting: Psychiatry

## 2019-11-28 ENCOUNTER — Other Ambulatory Visit: Payer: Self-pay

## 2019-11-28 ENCOUNTER — Encounter: Payer: Self-pay | Admitting: Psychiatry

## 2019-11-28 DIAGNOSIS — F411 Generalized anxiety disorder: Secondary | ICD-10-CM | POA: Diagnosis not present

## 2019-11-28 NOTE — Progress Notes (Signed)
      [facility] Counselor/Therapist Progress Note  Patient ID: Jose [name], MRN: [id number],    Date: 11/28/2019  Time Spent: 50 minutes   Treatment Type: Individual Therapy  Reported Symptoms: anxiety, racing thoughts  Mental Status Exam:  Appearance:   Casual     Behavior:  Appropriate  Motor:  Normal  Speech/Language:   Clear and Coherent  Affect:  Appropriate  Mood:  anxious  Thought process:  normal  Thought content:    Rumination  Sensory/Perceptual disturbances:    WNL  Orientation:  oriented to person, place, time/date and situation  Attention:  Good  Concentration:  Good  Memory:  WNL  Fund of knowledge:   Good  Insight:    Good  Judgment:   Good  Impulse Control:  Good   Risk Assessment: Danger to Self:  No Self-injurious Behavior: No Danger to Others: No Duty to Warn:no Physical Aggression / Violence:No  Access to Firearms a concern: No  Gang Involvement:No   Subjective: The client states that he has been staying away from his dad because he tends to be so negative.  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  He states today that his mind is back to going 100 miles a minute.  He also identifies that he has more anxiety and he is not sure what has changed.  I used eye-movement with the client in the bilateral stimulation hand paddles to help reduce the client's anxiety from an 8+ to around 6. The client was wondering if it was time to increase his medication to deal with the breakthrough anxiety?  He had asked me to speak with [name] so he would not have to wait to his next appointment.  We were not able to reduce the clients anxiety due to to the lack of time at the end of the session.  He will focus on his self-care, exercise and practicing mood independent behavior.  Interventions: Assertiveness/Communication, Motivational  Interviewing, Solution-Oriented/Positive Psychology, [employer] Desensitization and Reprocessing (EMDR) and Insight-Oriented  Diagnosis:   ICD-10-CM   1. Generalized anxiety disorder  F41.1     Plan: Mood independent behavior, self-care, exercise, positive self talk.  Jose [name], [facility]

## 2019-12-12 ENCOUNTER — Telehealth: Payer: Self-pay | Admitting: Psychiatry

## 2019-12-12 NOTE — Telephone Encounter (Signed)
Patient called and left a messsage and said that he would like to talk to the nurse. He wants to increase his medicine. Please give him a call at [phone]

## 2019-12-12 NOTE — Telephone Encounter (Signed)
Talked with Jose [name]'s wife.  She said is he more adgitated and angry.  He has seen [name] a couple of times recently and [name] felt a change in his medication would be helpful.  It was suggested that the Sertraline be increased.  She felt that [name] had talked with [name] about this.  If ok, send new prescription to [facility] in [place]

## 2019-12-12 NOTE — Telephone Encounter (Signed)
Left message for him to call back with symptoms and what medication he's wanting to adjust

## 2019-12-12 NOTE — Telephone Encounter (Signed)
I am not comfortable increasing the sertraline over the phone without seeing him for an appointment.  Have a move his appointment earlier.  However I am comfortable with having him increase Trileptal to 600 mg twice daily.  Verify that he is currently taking 300 mg 1 in the morning and 2 at night.  The last prescription looks like it was written for just 1 twice a day.  Please find out whether he is taking 2 or 3 Trileptal daily.

## 2019-12-13 ENCOUNTER — Other Ambulatory Visit: Payer: Self-pay

## 2019-12-13 DIAGNOSIS — F39 Unspecified mood [affective] disorder: Secondary | ICD-10-CM

## 2019-12-13 MED ORDER — OXCARBAZEPINE 300 MG PO TABS: ORAL_TABLET | ORAL | 0 refills | Status: DC

## 2019-12-13 NOTE — Telephone Encounter (Signed)
Wife, Morey Hummingbird, called back. Would like to increase the Trileptel to 1 am and 2 pm, He is currently taking bid. Please send update to [facility]. Moved appt up.

## 2019-12-13 NOTE — Telephone Encounter (Signed)
Return call. Please try to reach Pt again @ [phone]

## 2019-12-13 NOTE — Telephone Encounter (Signed)
Left message to call back again

## 2019-12-13 NOTE — Telephone Encounter (Signed)
Thanks

## 2019-12-13 NOTE — Telephone Encounter (Signed)
Called left message [name] would prefer to increase his Trileptal, just need to confirm his dose first then a updated Rx will be submitted to [facility]

## 2019-12-15 ENCOUNTER — Other Ambulatory Visit: Payer: Self-pay | Admitting: Psychiatry

## 2019-12-15 DIAGNOSIS — F39 Unspecified mood [affective] disorder: Secondary | ICD-10-CM

## 2019-12-18 ENCOUNTER — Other Ambulatory Visit: Payer: Self-pay

## 2019-12-18 ENCOUNTER — Ambulatory Visit (INDEPENDENT_AMBULATORY_CARE_PROVIDER_SITE_OTHER): Payer: Self-pay | Admitting: Psychiatry

## 2019-12-18 ENCOUNTER — Encounter: Payer: Self-pay | Admitting: Psychiatry

## 2019-12-18 DIAGNOSIS — R454 Irritability and anger: Secondary | ICD-10-CM

## 2019-12-18 DIAGNOSIS — F411 Generalized anxiety disorder: Secondary | ICD-10-CM

## 2019-12-18 NOTE — Progress Notes (Signed)
      [facility] Counselor/Therapist Progress Note  Patient ID: EL PILE, MRN: [id number],    Date: 12/18/2019  Time Spent: 50 minutes   Treatment Type: Individual Therapy  Reported Symptoms: anger, anxiety  Mental Status Exam:  Appearance:   Casual     Behavior:  Appropriate  Motor:  Normal  Speech/Language:   Clear and Coherent  Affect:  Appropriate  Mood:  angry and anxious  Thought process:  normal  Thought content:    WNL  Sensory/Perceptual disturbances:    WNL  Orientation:  oriented to person, place, time/date and situation  Attention:  Good  Concentration:  Good  Memory:  WNL  Fund of knowledge:   Good  Insight:    Good  Judgment:   Good  Impulse Control:  Good   Risk Assessment: Danger to Self:  No Self-injurious Behavior: No Danger to Others: No Duty to Warn:no Physical Aggression / Violence:No  Access to Firearms a concern: No  Gang Involvement:No   Subjective: The client states that he had contacted [name] who increased his Trileptal by 1 pill.  "It has slowed me way down.  I used to have thousand thoughts now I have 10."  Before the medication change the client's anger had become significant.  His father had discussed that he was leaving his estate to the client's sister.  The client is well situated financially but was hurt that his dad did not want to leave him anything.  He grew up in an emotionally barren childhood where his father withheld love and affection from the client.  When his anger began to kick up he found himself taking it out on his sister.  It caused her to leave town, flee to [place], [place] and ultimately get hospitalized.  The client acknowledges that it is his fault.  I use the bilateral stimulation hand paddles with the client focusing on his sister.  His negative cognition is, "it is all my fault."  He feels shame and remorse in his chest.  His subjective units of distress is a 6.  As the client processed he became  more tearful and remorseful at what he had done.  We discussed the need not only to ask forgiveness from his sister but also forgiveness from himself.  The client visualized being on his farm where he met [place].  He was able to let go of some things.  His subjective units of distress was less than 2 at the end of the session.  The client is calm.  Interventions: Assertiveness/Communication, Motivational Interviewing, Solution-Oriented/Positive Psychology, [employer] Desensitization and Reprocessing (EMDR) and Insight-Oriented  Diagnosis:   ICD-10-CM   1. Generalized anxiety disorder  F41.1   2. Anger  R45.4     Plan: Self forgiveness, positive self talk, self-care, assertiveness, boundaries, talk to his sister.  Gianni Fuchs, [facility]

## 2019-12-20 ENCOUNTER — Other Ambulatory Visit: Payer: Self-pay

## 2019-12-20 ENCOUNTER — Ambulatory Visit (INDEPENDENT_AMBULATORY_CARE_PROVIDER_SITE_OTHER): Payer: Self-pay | Admitting: Psychiatry

## 2019-12-20 ENCOUNTER — Encounter: Payer: Self-pay | Admitting: Psychiatry

## 2019-12-20 DIAGNOSIS — F4310 Post-traumatic stress disorder, unspecified: Secondary | ICD-10-CM

## 2019-12-20 DIAGNOSIS — F39 Unspecified mood [affective] disorder: Secondary | ICD-10-CM

## 2019-12-20 DIAGNOSIS — R454 Irritability and anger: Secondary | ICD-10-CM

## 2019-12-20 DIAGNOSIS — F411 Generalized anxiety disorder: Secondary | ICD-10-CM

## 2019-12-20 DIAGNOSIS — F5105 Insomnia due to other mental disorder: Secondary | ICD-10-CM

## 2019-12-20 NOTE — Progress Notes (Signed)
Jose [name] [id number] [birth year] 49 y.o.  Subjective:   Patient ID:  Jose [name] is a 49 y.o. (DOB 1971/07/19) male.  Chief Complaint:  Chief Complaint  Patient presents with  . Follow-up  . Agitation    HPI Jose [name] presents to the office today for follow-up of anger, irritability and anxiety.  At visit in April 23.  Started sertraline 50 mg and reduced lamotrigine to 100 mg daily.  Still irritable but better control of the anger.    When seen in June 2020.  For continued symptoms oxcarbazepine was increased to 900 mg daily.  He was weaned off lamotrigine because it was not felt to be very helpful.  He continued mirtazapine for sleep.  He continued low-dose sertraline for anxiety because Initially sertraline at HS caused insomnia and wired him. Up.    Last seen January 2021.Marland Kitchen  No meds were changed.   He called December 12, 2019 wanting to increase medication.  At that time his wife told staff that he was more agitated and angry.  He had discussed this with his therapist [name] and they agreed a med change might be helpful.  He was instructed to increase Trileptal with an additional 300 mg daily.  Also to move his appointment up.  As of appointment December 20, 2019 the following is noted:  Seen with wife, Jose [name]. A lot going on and a lot of stress and demands.  Admits chronic anger problems in work environment.  Can't get away with anger outbursts anymore.  Hard life the way he and sister raised.  W said he's perfectionistic and a workaholic.   Increased Trileptal to 900 mg from 600 mg daily and it's slowed him down and reduced the racing thoughts.  No SE to it. She says anger was worse with added stress at work.  Gets foul language and obsesses on minor things and loses his temper.  With the increase it helped significantly by taking more time to think things through and less agitated. Sleep is great   Leave it like it is.  Doing pretty good. Mirtazapine works for sleep.   No med changes since here.  Has cont counseling.  Wife said leave everything alone and doing well.  Still problems with low sex drive.  Still doesn't really want  Him to change but ambivalent.  Can still get anger but handles it better and lets it go easier.     Change to 100 mg on March 06, 2019.  Can let go of anger quicker.  Still doing well with it unless provoked at work.  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   Wife noticed I don't hold onto it.   still get mad but handling it better.  Been angry all his life.  Grew up in a bad situation with bipolar, schizophrenic with hospitalizations as a kid.  Has a farm with equipment.  Anxiety is high bc involved in a project.  Still jump on people including father and sister yesterday.  I've done well for myself and I tolerate very little.  But is generous.   Probably way worse than he really lets on.  I got issues.  sex drive now better with sertraline.  Patient reports stable mood and denies depressed but is irritable moods.  He has struggled with anger all his life and does not believe that we will ever fully resolved.  Patient denies difficulty with sleep initiation or maintenance.  Sleep like a baby.  Denies appetite disturbance.  Patient reports that energy and motivation have been good.  Patient denies any difficulty with concentration.  Patient denies any suicidal ideation.  On [place] obsessive-compulsive inventory he answered yes to obsessive thoughts 1 through 5, no to 6 and 7 yes to 7 and 8, noted #10 yes to 1112 and 13, no 214 1516, yes to 17, no to 1819, yesterday #20.  On part B he answered to his extreme to questions 124 and 5 and little control of #3.  No history of D&A abuse.  Past Psychiatric Medication Trials: Depakote, lamotrigine, Equetro,  Trileptal ,  Latuda 20, Sertraline 100 ,   fluvoxamine  Review of Systems:  Review of Systems  Neurological: Negative for tremors, weakness and headaches.     Medications: I have reviewed the patient's current medications.  Current Outpatient Medications  Medication Sig Dispense Refill  . amLODipine (NORVASC) 5 MG tablet 5 mg.    . Ascorbic Acid (VITAMIN C) 100 MG tablet Take 100 mg by mouth daily.    . Cholecalciferol (VITAMIN D3) 25 MCG (1000 UT) CAPS Take by mouth.    . esomeprazole (NEXIUM) 20 MG capsule Take 20 mg by mouth daily.     . fexofenadine (ALLEGRA) 180 MG tablet Take 180 mg by mouth daily as needed for allergies.     Marland Kitchen HYDROCHLOROTHIAZIDE PO Take 25 mg by mouth daily.     Marland Kitchen losartan (COZAAR) 100 MG tablet Take 100 mg by mouth daily.     . mirtazapine (REMERON) 15 MG tablet TAKE 0.5-1 TABLETS (7.5-15 MG TOTAL) BY MOUTH AT BEDTIME. 90 tablet 1  . Omega-3 Fatty Acids (FISH OIL) 1000 MG CAPS Take by mouth.    . Oxcarbazepine (TRILEPTAL) 300 MG tablet Take 1 tablet (300 mg) by mouth in the morning and 2 tablets (600 mg) at bedtime. 270 tablet 0  . sertraline (ZOLOFT) 100 MG tablet TAKE 1 TABLET DAILY 90 tablet 1  . tamsulosin (FLOMAX) 0.4 MG CAPS capsule Take 1 capsule (0.4 mg total) by mouth 2 (two) times daily. 180 capsule 3   No current facility-administered medications for this visit.    Medication Side Effects:  Marked sexual SE.  Looser BM   Allergies:  Allergies  Allergen Reactions  . Shellfish Allergy Anaphylaxis  . Penicillins     Unknown, childhood reaction Has patient had a PCN reaction causing immediate rash, facial/tongue/throat swelling, SOB or lightheadedness with hypotension: Unknown Has patient had a PCN reaction causing severe rash involving mucus membranes or skin necrosis: Unknown Has patient had a PCN reaction that required hospitalization No Has patient had a PCN reaction occurring within the last 10 years: No If all of the above answers are "NO", then may proceed with Cephalosporin use.     Past Medical History:  Diagnosis Date  . Diverticulitis   . GERD (gastroesophageal reflux disease)   .  Hypertension   . Seasonal allergies     Family History  Problem Relation Age of Onset  . Heart disease Mother   . Diabetes Mother   . Cancer Father        prostate  . Heart disease Unknown   . Cancer Unknown     Social History   Socioeconomic History  . Marital status: Married    Spouse name: Jose [name]  . Number of children: 2  . Years of education: 32  . Highest education level: Not on file  Occupational History    Employer:  [employer]    Comment: [employer]  Tobacco Use  . Smoking status: Never Smoker  . Smokeless tobacco: Never Used  Substance and Sexual Activity  . Alcohol use: No  . Drug use: No  . Sexual activity: Not on file  Other Topics Concern  . Not on file  Social History Narrative  . Not on file   Social Determinants of Health   Financial Resource Strain:   . Difficulty of Paying Living Expenses:   Food Insecurity:   . Worried About Charity fundraiser in the Last Year:   . Arboriculturist in the Last Year:   Transportation Needs:   . Film/video editor (Medical):   Marland Kitchen Lack of Transportation (Non-Medical):   Physical Activity:   . Days of Exercise per Week:   . Minutes of Exercise per Session:   Stress:   . Feeling of Stress :   Social Connections:   . Frequency of Communication with Friends and Family:   . Frequency of Social Gatherings with Friends and Family:   . Attends Religious Services:   . Active Member of Clubs or Organizations:   . Attends Archivist Meetings:   Marland Kitchen Marital Status:   Intimate Partner Violence:   . Fear of Current or Ex-Partner:   . Emotionally Abused:   Marland Kitchen Physically Abused:   . Sexually Abused:     Past Medical History, Surgical history, Social history, and Family history were reviewed and updated as appropriate.   Please see review of systems for further details on the patient's review from today.   Objective:   Physical Exam:  There were no vitals taken for this visit.  Physical  Exam Constitutional:      General: He is not in acute distress.    Appearance: He is well-developed. He is obese.  Musculoskeletal:        General: No deformity.  Neurological:     Mental Status: He is alert and oriented to person, place, and time.     Coordination: Coordination normal.  Psychiatric:        Attention and Perception: Attention normal. He is attentive.        Mood and Affect: Mood is anxious. Mood is not depressed. Affect is not labile, blunt, angry or inappropriate.        Speech: Speech normal.        Behavior: Behavior normal. Behavior is not agitated or aggressive.        Thought Content: Thought content normal. Thought content does not include homicidal or suicidal ideation. Thought content does not include homicidal or suicidal plan.        Cognition and Memory: Cognition normal.        Judgment: Judgment normal.     Comments: Insight is fair.  He still has irritability but was certainly appropriate in the office.      Lab Review:     Component Value Date/Time   NA 144 03/12/2012 1100   K 3.6 03/12/2012 1100   CL 104 03/12/2012 1100   CO2 29 03/12/2012 1100   GLUCOSE 108 (H) 03/12/2012 1100   BUN 15 03/12/2012 1100   CREATININE 0.83 03/12/2012 1100   CALCIUM 10.5 03/12/2012 1100   PROT 6.4 10/13/2010 2037   ALBUMIN 4.7 10/13/2010 2037   AST 16 10/13/2010 2037   ALT 19 10/13/2010 2037   ALKPHOS 57 10/13/2010 2037   BILITOT 1.4 (H) 10/13/2010 2037   GFRNONAA >90 03/12/2012  1100   GFRAA >90 03/12/2012 1100       Component Value Date/Time   WBC 10.0 09/27/2010 1417   RBC 5.24 09/27/2010 1417   HGB 16.4 03/12/2012 1100   HCT 45.6 03/12/2012 1100   PLT 218 09/27/2010 1417   MCV 84.2 09/27/2010 1417   MCH 30.2 09/27/2010 1417   MCHC 35.8 09/27/2010 1417   RDW 12.9 09/27/2010 1417   LYMPHSABS 2.9 09/27/2010 1417   MONOABS 0.5 09/27/2010 1417   EOSABS 0.3 09/27/2010 1417   BASOSABS 0.1 09/27/2010 1417    No results found for: POCLITH, LITHIUM    No results found for: PHENYTOIN, PHENOBARB, VALPROATE, CBMZ   .res Assessment: Plan:    Jose [name] was seen today for follow-up and agitation.  Diagnoses and all orders for this visit:  Episodic mood disorder ([place])  PTSD (post-traumatic stress disorder)  Generalized anxiety disorder  Anger  Insomnia due to mental condition     History of bipolar disorder versus major depression Rule out intermittent explosive disorder Rule out OCD variant but it seems to just focus on perfectionism.  Greater than 50% of face to face time with patient was spent on counseling and coordination of care. We discussed Patient describes history of perfectionism and emotional intensity that sometimes leads to outbursts of anger.  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  Has lost friendships over anger outbursts.  Has worked on it in therapy. Unclear about a  distinct pattern of cyclic mania but the meds that have been used are meds that are often used to treat anger outbursts effectively.   However he and his wife feel that his anxiety which sometimes leads to anger is no longer adequately managed.   It has caused problems and family relationships recently.  Simplest step would be to increase oxcarbazepine to 2 tablets twice daily this has the potential to help anxiety and irritability also and does not have a risk of making him more hyper.  He did not notice any particular benefit with this increase but we could consider going higher if need be and it has a low sexual side effect risk.  Discussed side effect issues in detail.  Continue sertraline 100 mg daily.  He clearly saw additional benefit with the increase in sertraline and that he is less ruminative on his anger.  He is open to the possibility of increasing this as an alternative to increasing the oxcarbazepine if is not effective enough.  Sexual side effects have resolved sertraline does more for him than any other med he's taking.  So relieved to be in control of his anger and doesn't want to change it.  Wife pleased with the response and doesn't want him to change anything.  Recent PE unremarkable.  The mirtazapine is effective for his sleep at one half of a 15 mg tablet so we will not change that.  Continue counseling as needed he is.  He is found that helpful  Follow-up 6 weeks  Lynder Parents, MD, DFAPA    Future Appointments  Date Time Provider [place]  01/08/2020  8:00 AM May, [place], [facility] CP-CP None  01/31/2020  9:00 AM May, [place], [facility] CP-CP None  01/31/2020 10:30 AM Cottle, Billey Co., MD CP-CP None  02/18/2020  9:00 AM May, [place], [facility] CP-CP None  03/18/2020  8:00 AM May, [place], [facility] CP-CP None  10/20/2020  9:30 AM Franchot Gallo, MD [facility] None    No orders of the defined  types were placed in this encounter.   -------------------------------

## 2020-01-08 ENCOUNTER — Encounter: Payer: Self-pay | Admitting: Psychiatry

## 2020-01-08 ENCOUNTER — Ambulatory Visit (INDEPENDENT_AMBULATORY_CARE_PROVIDER_SITE_OTHER): Payer: BC Managed Care – PPO | Admitting: Psychiatry

## 2020-01-08 ENCOUNTER — Ambulatory Visit: Payer: Self-pay | Admitting: Psychiatry

## 2020-01-08 ENCOUNTER — Other Ambulatory Visit: Payer: Self-pay

## 2020-01-08 DIAGNOSIS — F4323 Adjustment disorder with mixed anxiety and depressed mood: Secondary | ICD-10-CM | POA: Diagnosis not present

## 2020-01-08 NOTE — Progress Notes (Signed)
      [facility] Counselor/Therapist Progress Note  Patient ID: Jose [name], MRN: [id number],    Date: 01/08/2020  Time Spent: 50 minutes   Treatment Type: Individual Therapy  Reported Symptoms: sad, anxious  Mental Status Exam:  Appearance:   Casual     Behavior:  Appropriate  Motor:  Normal  Speech/Language:   Clear and Coherent  Affect:  Appropriate  Mood:  anxious and sad  Thought process:  normal  Thought content:    WNL  Sensory/Perceptual disturbances:    WNL  Orientation:  oriented to person, place, time/date and situation  Attention:  Good  Concentration:  Good  Memory:  WNL  Fund of knowledge:   Good  Insight:    Good  Judgment:   Good  Impulse Control:  Good   Risk Assessment: Danger to Self:  No Self-injurious Behavior: No Danger to Others: No Duty to Warn:no Physical Aggression / Violence:No  Access to Firearms a concern: No  Gang Involvement:No   Subjective: The client states that he is a little depressed today.  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  He states that he is not "good today."  He has uncertainty about what his future is going to look like.  His sister is back but not working for the business.  There is still some unsettled business there between the client and his sister.  He states that he has not forgiven himself. Currently he is working on building a new house on his property.  He is enjoying the process and feels good about what he has accomplished.  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  We discussed meaning and the necessity of that in his life.  Even though the client can get frustrated and irritated with people he does at his heart love people.  I encouraged the client to read the book, "[employer] and [place]" as an example of a successful businessman getting back to the community around him that was poor and in need.   The client will consider this. The client is very invested in his marriage.  He sees his wife as a strong Jose [name] that protects him in so many ways.  I encouraged the client to continue to pursue his own faith as a way to bolster his own mental health.  The client agreed.  Interventions: Assertiveness/Communication, Mindfulness Meditation, Motivational Interviewing, Solution-Oriented/Positive Psychology, [employer] Desensitization and Reprocessing (EMDR) and Insight-Oriented  Diagnosis:   ICD-10-CM   1. Adjustment disorder with mixed anxiety and depressed mood  F43.23     Plan: Mindful prayer, positive self talk, self-care, social network, boundaries.  Jose [name], [facility]

## 2020-01-31 ENCOUNTER — Ambulatory Visit: Payer: Self-pay | Admitting: Psychiatry

## 2020-01-31 ENCOUNTER — Other Ambulatory Visit: Payer: Self-pay

## 2020-01-31 ENCOUNTER — Ambulatory Visit (INDEPENDENT_AMBULATORY_CARE_PROVIDER_SITE_OTHER): Payer: BC Managed Care – PPO | Admitting: Psychiatry

## 2020-01-31 ENCOUNTER — Encounter: Payer: Self-pay | Admitting: Psychiatry

## 2020-01-31 DIAGNOSIS — F411 Generalized anxiety disorder: Secondary | ICD-10-CM | POA: Diagnosis not present

## 2020-01-31 NOTE — Progress Notes (Signed)
      [facility] Counselor/Therapist Progress Note  Patient ID: Jose [name], MRN: [id number],    Date: 01/31/2020  Time Spent: 50 minutes   Treatment Type: Individual Therapy  Reported Symptoms: anxious  Mental Status Exam:  Appearance:   Casual     Behavior:  Appropriate  Motor:  Normal  Speech/Language:   Clear and Coherent  Affect:  Appropriate  Mood:  anxious  Thought process:  normal  Thought content:    WNL  Sensory/Perceptual disturbances:    WNL  Orientation:  oriented to person, place, time/date and situation  Attention:  Good  Concentration:  Good  Memory:  WNL  Fund of knowledge:   Good  Insight:    Good  Judgment:   Good  Impulse Control:  Good   Risk Assessment: Danger to Self:  No Self-injurious Behavior: No Danger to Others: No Duty to Warn:no Physical Aggression / Violence:No  Access to Firearms a concern: No  Gang Involvement:No   Subjective: The client has been working on building his house.  He says that it helps him be distracted from his ongoing anxiety.  He and his wife have been talking about getting the COVID-19 vaccination.  They had been signed up for the [employer] vaccination when it was pulled from distribution.  The client states he has had a lot of anxiety about getting the shot.  Now with the dust up around [facility] the client is afraid to move forward to get vaccinated.  Today I used the bilateral stimulation hand paddles with the client focusing on his anxiety with the COVID-19 vaccination.  His negative cognition is, "do not do it."  He feels anxiety in his head.  His subjective units of distress is a 7+.  As the client processed he stated, "I fear getting blood clots or getting something worse."  As we discussed this I asked the client if his father had been vaccinated?  He stated that he had.  I pointed out that since his father is a first-degree biological relatives that did well, there is a good chance that he would  do well also.  The client considered this and thought that Aerika Groll be true.  I also asked the client what supplements he took?  He is taking vitamin D3, vitamin C and omega-3 fatty acids.  I told the client that these are all recommended supplements to help improve a persons immune system.  I suggested that he might add zinc to his supplement intake.  I explained that zinc interferes with viruses ability to replicate.  The client agreed that he would add this to his regimen.  At this point his subjective units of distress was 0.  "I am very tired."  He also stated that if he could get the shot right now he would.  His positive cognition at the end of the session was, "I will be okay."  Interventions: Motivational Interviewing, Solution-Oriented/Positive Psychology, [employer] Group Desensitization and Reprocessing (EMDR) and Insight-Oriented  Diagnosis:   ICD-10-CM   1. Generalized anxiety disorder  F41.1     Plan: Mood independent behavior, positive self talk, self-care, boundaries, exercise, plan to get vaccinated.  Keywon Mestre, [facility]

## 2020-02-18 ENCOUNTER — Encounter: Payer: Self-pay | Admitting: Psychiatry

## 2020-02-18 ENCOUNTER — Other Ambulatory Visit: Payer: Self-pay

## 2020-02-18 ENCOUNTER — Ambulatory Visit (INDEPENDENT_AMBULATORY_CARE_PROVIDER_SITE_OTHER): Payer: BC Managed Care – PPO | Admitting: Psychiatry

## 2020-02-18 DIAGNOSIS — F4323 Adjustment disorder with mixed anxiety and depressed mood: Secondary | ICD-10-CM | POA: Diagnosis not present

## 2020-02-18 NOTE — Progress Notes (Signed)
      [facility] Counselor/Therapist Progress Note  Patient ID: Jose [name], MRN: [id number],    Date: 02/18/2020  Time Spent: 50 minutes   Treatment Type: Individual Therapy  Reported Symptoms: anxious, depressed  Mental Status Exam:  Appearance:   Casual     Behavior:  Appropriate  Motor:  Normal  Speech/Language:   Clear and Coherent  Affect:  Appropriate  Mood:  anxious and depressed  Thought process:  normal  Thought content:    WNL  Sensory/Perceptual disturbances:    WNL  Orientation:  oriented to person, place, time/date and situation  Attention:  Good  Concentration:  Good  Memory:  WNL  Fund of knowledge:   Good  Insight:    Good  Judgment:   Good  Impulse Control:  Good   Risk Assessment: Danger to Self:  No Self-injurious Behavior: No Danger to Others: No Duty to Warn:no Physical Aggression / Violence:No  Access to Firearms a concern: No  Gang Involvement:No   Subjective: The client has been currently working on building his house.  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  Client states that he has stayed away from working at [employer] for the last 3 years because of the difficulty he has in controlling his temper.  He also can no longer do the physical labor involved in that job.  He states this depresses him quite a bit.  As result he has a short fuse.  He has done better.  He tries especially not to blow up on his wife.  We discussed at length the issues surrounding the client's short fuse.  I suggested that most likely comes out of his family of origin.  He agreed with that.  He also feels that he has been image to live up to since his family has been so involved in [place] early part of the last century. The client agrees to pursue EMDR at next session to begin to work through the negative emotional content that he experiences related to  anything that does not go right.  Interventions: Assertiveness/Communication, Motivational Interviewing, Solution-Oriented/Positive Psychology, [employer] Desensitization and Reprocessing (EMDR) and Insight-Oriented  Diagnosis:   ICD-10-CM   1. Generalized anxiety disorder  F41.1     Plan: Mood independent behavior, positive self talk, self-care, exercise, outdoor activities.  Doll Frazee, [facility]

## 2020-03-17 ENCOUNTER — Ambulatory Visit: Payer: Self-pay | Admitting: Psychiatry

## 2020-03-18 ENCOUNTER — Other Ambulatory Visit: Payer: Self-pay

## 2020-03-18 ENCOUNTER — Encounter: Payer: Self-pay | Admitting: Psychiatry

## 2020-03-18 ENCOUNTER — Ambulatory Visit (INDEPENDENT_AMBULATORY_CARE_PROVIDER_SITE_OTHER): Payer: BC Managed Care – PPO | Admitting: Psychiatry

## 2020-03-18 DIAGNOSIS — F4323 Adjustment disorder with mixed anxiety and depressed mood: Secondary | ICD-10-CM

## 2020-03-18 NOTE — Progress Notes (Signed)
      [facility] Counselor/Therapist Progress Note  Patient ID: Jose [name], MRN: [id number],    Date: 03/18/2020  Time Spent: 50 minutes   Treatment Type: Individual Therapy  Reported Symptoms: irritable, anxious  Mental Status Exam:  Appearance:   Casual     Behavior:  Appropriate  Motor:  Normal  Speech/Language:   Clear and Coherent  Affect:  Appropriate  Mood:  anxious and irritable  Thought process:  normal  Thought content:    WNL  Sensory/Perceptual disturbances:    WNL  Orientation:  oriented to person, place, time/date and situation  Attention:  Good  Concentration:  Good  Memory:  WNL  Fund of knowledge:   Good  Insight:    Good  Judgment:   Good  Impulse Control:  Good   Risk Assessment: Danger to Self:  No Self-injurious Behavior: No Danger to Others: No Duty to Warn:no Physical Aggression / Violence:No  Access to Firearms a concern: No  Gang Involvement:No   Subjective: The client is still anxious about COVID and about possibly getting the shot..  His dad had received the Moderna shot and has now developed blood clots.  The client is still very unsure if the the client is still very and his wife the client will get vaccinated. The client is staying with his current medication regimen.  He states it does reduce his irritability.  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  "It would be a relief."  The client realizes that he has so much reactivity to his work.  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  He notes he has been better with his father especially since his diagnosis with blood clots.  The client has been much more intentional with both his father and his wife.  He still wants to use the EMDR to work on reactivity.  Interventions: Motivational Interviewing, Solution-Oriented/Positive Psychology and  Insight-Oriented  Diagnosis:   ICD-10-CM   1. Adjustment disorder with mixed anxiety and depressed mood  F43.23     Plan: Meds, self care, positive self talk.  Avett Reineck, [facility]

## 2020-03-27 ENCOUNTER — Telehealth: Payer: Self-pay | Admitting: Psychiatry

## 2020-03-27 ENCOUNTER — Other Ambulatory Visit: Payer: Self-pay

## 2020-03-27 DIAGNOSIS — F39 Unspecified mood [affective] disorder: Secondary | ICD-10-CM

## 2020-03-27 DIAGNOSIS — F411 Generalized anxiety disorder: Secondary | ICD-10-CM

## 2020-03-27 MED ORDER — OXCARBAZEPINE 300 MG PO TABS: ORAL_TABLET | ORAL | 0 refills | Status: DC

## 2020-03-27 MED ORDER — SERTRALINE HCL 100 MG PO TABS: ORAL_TABLET | ORAL | 1 refills | Status: DC

## 2020-03-27 NOTE — Telephone Encounter (Signed)
[name] [phone] requesting refills on Trileptal 300 mg and Zoloft 100 mg.

## 2020-03-27 NOTE — Telephone Encounter (Signed)
Rxs sent

## 2020-04-13 DIAGNOSIS — D239 Other benign neoplasm of skin, unspecified: Secondary | ICD-10-CM | POA: Diagnosis not present

## 2020-04-13 DIAGNOSIS — L821 Other seborrheic keratosis: Secondary | ICD-10-CM | POA: Diagnosis not present

## 2020-04-13 DIAGNOSIS — D485 Neoplasm of uncertain behavior of skin: Secondary | ICD-10-CM | POA: Diagnosis not present

## 2020-04-22 ENCOUNTER — Other Ambulatory Visit: Payer: Self-pay

## 2020-04-22 ENCOUNTER — Encounter: Payer: Self-pay | Admitting: Psychiatry

## 2020-04-22 ENCOUNTER — Ambulatory Visit (INDEPENDENT_AMBULATORY_CARE_PROVIDER_SITE_OTHER): Payer: BC Managed Care – PPO | Admitting: Psychiatry

## 2020-04-22 DIAGNOSIS — F4323 Adjustment disorder with mixed anxiety and depressed mood: Secondary | ICD-10-CM

## 2020-04-22 NOTE — Progress Notes (Signed)
      [facility] Counselor/Therapist Progress Note  Patient ID: Jose [name], MRN: [id number],    Date: 04/22/2020  Time Spent: 50 minutes   Treatment Type: Individual Therapy  Reported Symptoms: irritable, anxious  Mental Status Exam:  Appearance:   Casual     Behavior:  Appropriate  Motor:  Normal  Speech/Language:   Clear and Coherent  Affect:  Appropriate  Mood:  anxious and irritable  Thought process:  normal  Thought content:    WNL  Sensory/Perceptual disturbances:    WNL  Orientation:  oriented to person, place, time/date and situation  Attention:  Good  Concentration:  Good  Memory:  WNL  Fund of knowledge:   Good  Insight:    Good  Judgment:   Good  Impulse Control:  Good   Risk Assessment: Danger to Self:  No Self-injurious Behavior: No Danger to Others: No Duty to Warn:no Physical Aggression / Violence:No  Access to Firearms a concern: No  Gang Involvement:No   Subjective: "I have not gotten the shot."  The client's father got the [employer] vaccine and ended up with blood clots.  The client stated that his dad went for precheck for his colonoscopy it showed that he was in atrial fibrillation.  Most recently the client had to take his dad to the hospital for shortness of breath.  They determined that there was a conflict between his dad's blood thinner and blood pressure medicine. I asked the client the question "are you ready for your dad's death?"  The client stated, "I am struggling to forgive him."  The client describes a very conflictual relationship with his dad.  His dad has been very competitive with him over time.  The client feels that his dad is jealous of his success.  Across from the client's farm was more family land owned by his great aunt.  He had talked to his dad about purchasing the land.  His dad told him it was a bad idea because a power line ran through the property.  A few months later the client found out that his dad had  purchased the property.  He ended up buying the property from his dad but he had almost doubled the price deliberately to gouge his son.  All of this has contributed to the bitterness that the client feels.  Today he use the bilateral stimulation hand paddles as he discussed his anger with his dad.  He is hesitant to express how angry he is because he does not want this therapist to think poorly of him.  I reminded him that I was here to help him and not to judge him.  The client will consider that as we continue to discuss this at next session.  Interventions: Assertiveness/Communication, Motivational Interviewing, Solution-Oriented/Positive Psychology, [facility] Desensitization [facility] Reprocessing (EMDR) and Insight-Oriented  Diagnosis:   ICD-10-CM   1. Adjustment disorder with mixed anxiety and depressed mood  F43.23     Plan: Mood independent behavior, positive self talk, radical acceptance, forgiveness with that.  Laren Whaling, [facility]

## 2020-05-20 ENCOUNTER — Ambulatory Visit: Payer: BC Managed Care – PPO | Admitting: Psychiatry

## 2020-06-03 ENCOUNTER — Encounter: Payer: Self-pay | Admitting: Psychiatry

## 2020-06-03 ENCOUNTER — Ambulatory Visit (INDEPENDENT_AMBULATORY_CARE_PROVIDER_SITE_OTHER): Payer: BC Managed Care – PPO | Admitting: Psychiatry

## 2020-06-03 DIAGNOSIS — F4323 Adjustment disorder with mixed anxiety and depressed mood: Secondary | ICD-10-CM

## 2020-06-03 NOTE — Progress Notes (Signed)
[facility] Counselor/Therapist Progress Note  Patient ID: Jose [name], MRN: [id number],    Date: 06/03/2020  Time Spent: 50 minutes   Treatment Type: Individual Therapy  Reported Symptoms: anxiety, irritability  Mental Status Exam:  Appearance:   Casual     Behavior:  Appropriate  Motor:  Normal  Speech/Language:   Clear and Coherent  Affect:  Appropriate  Mood:  anxious and irritable  Thought process:  normal  Thought content:    WNL  Sensory/Perceptual disturbances:    WNL  Orientation:  oriented to person, place, time/date and situation  Attention:  Good  Concentration:  Good  Memory:  WNL  Fund of knowledge:   Good  Insight:    Good  Judgment:   Good  Impulse Control:  Good   Risk Assessment: Danger to Self:  No Self-injurious Behavior: No Danger to Others: No Duty to Warn:no Physical Aggression / Violence:No  Access to Firearms a concern: No  Gang Involvement:No   Subjective: I connected with this patient by an approved telecommunication method (video), with his informed consent, and verifying identity and patient privacy.  I was located at my office and patient at his home.  As needed, we discussed the limitations, risks, and security and privacy concerns associated with telehealth service, including the availability and conditions which currently govern in-person appointments and the possibility that 3rd-party payment Eisa Conaway not be fully guaranteed and he Skylie Hiott be responsible for charges.  After he indicated understanding, we proceeded with the session.  Also discussed treatment planning, as needed, including ongoing verbal agreement with the plan, the opportunity to ask and answer all questions, his demonstrated understanding of instructions, and his readiness to call the office should symptoms worsen or he feels he is in a crisis state and needs more immediate and tangible assistance.  The client states that he has been under the weather due to his allergies.   He did not come today in person out of an abundance of caution.  Since he saw me last time he has been completing some building projects on his property.  He also has started a small sawmill.  He has planks of white and red Oak up to 26 inches wide and 9 foot-long.  He is turning these into tables for use in his house and at the wedding venue that they have set up.  The client states when he is working with the wood that his mind slows down completely.  "All I think about is the would."  I pointed out to the client that this is a mindfulness practice and how helpful it is to control his thought process.  It also decreases his anxiety.  The client agreed.  I asked him to try to be more intentional about this. 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  They hope it will close by the end of December of this year.  At that point the client and his wife will be able to spend more time together and take some vacations that they have wanted to do.  In the meantime he has helped different friends out on their property.  Recently he helped construct a log home from a kit.  He found it very interesting and was glad to learn the process. The client has some contact with his father.  He finds that if he is irritable he chooses not to be around his dad.  He finds this the best way to  control how he interacts with him.  He makes a conscious decision to go out on his property by himself and work, which helps his mood.  Interventions: Mindfulness Meditation, Motivational Interviewing, Solution-Oriented/Positive Psychology and Insight-Oriented  Diagnosis:   ICD-10-CM   1. Adjustment disorder with mixed anxiety and depressed mood  F43.23     Plan: Mood independent behavior, positive self talk, self-care, mindfulness.  Deborha Moseley, [facility]

## 2020-06-18 ENCOUNTER — Ambulatory Visit (INDEPENDENT_AMBULATORY_CARE_PROVIDER_SITE_OTHER): Payer: BC Managed Care – PPO | Admitting: Psychiatry

## 2020-06-18 ENCOUNTER — Encounter: Payer: Self-pay | Admitting: Psychiatry

## 2020-06-18 DIAGNOSIS — F4322 Adjustment disorder with anxiety: Secondary | ICD-10-CM

## 2020-06-18 NOTE — Progress Notes (Signed)
[facility] Counselor/Therapist Progress Note  Patient ID: Jose [name], MRN: [id number],    Date: 06/18/2020  Time Spent: 50 minutes   Treatment Type: Individual Therapy  Reported Symptoms: anxiety  Mental Status Exam:  Appearance:   Casual     Behavior:  Appropriate  Motor:  Normal  Speech/Language:   Clear and Coherent  Affect:  Appropriate  Mood:  anxious  Thought process:  normal  Thought content:    Rumination  Sensory/Perceptual disturbances:    WNL  Orientation:  oriented to person, place, time/date and situation  Attention:  Good  Concentration:  Good  Memory:  WNL  Fund of knowledge:   Good  Insight:    Good  Judgment:   Good  Impulse Control:  Good   Risk Assessment: Danger to Self:  No Self-injurious Behavior: No Danger to Others: No Duty to Warn:no Physical Aggression / Violence:No  Access to Firearms a concern: No  Gang Involvement:No   Subjective: I connected with this patient by an approved telecommunication method (video), with his informed consent, and verifying identity and patient privacy.  I was located at my office and patient at his home.  As needed, we discussed the limitations, risks, and security and privacy concerns associated with telehealth service, including the availability and conditions which currently govern in-person appointments and the possibility that 3rd-party payment Samadhi Mahurin not be fully guaranteed and he Leler Brion be responsible for charges.  After he indicated understanding, we proceeded with the session.  Also discussed treatment planning, as needed, including ongoing verbal agreement with the plan, the opportunity to ask and answer all questions, his demonstrated understanding of instructions, and his readiness to call the office should symptoms worsen or he feels he is in a crisis state and needs more immediate and tangible assistance.  The client reports that he is not in a good place today with his anxiety.  He is not  vaccinated and has become more afraid of leaving his house.  "I know I am safe here."  His wife recently got the first vaccination and that has gone well.  If her second vaccination goes okay the client will go ahead and get his.  He has been afraid of side effects with the vaccine which is what has held him back.  He has gotten to the point where he feels that he should get the vaccine because he is overweight and has high blood pressure with borderline diabetes.  "I am just scared". I encouraged the client that many people have gotten the vaccine with no difficulty.  I pointed out that the media tends to focus on the problem vaccinations which makes everyone afraid.  He agreed. 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  This has been the main thing that has defined the client in his adult life.  He came out of very poor circumstances, worked hard and developed a very successful business.  By the end of the year they will have sold it.  The client is focused on developing other things that he can do that allow him to be creative but also develop a revenue stream.  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  He is in the process of finishing buildings on his property that will become part of a wedding venue.  He is also doing some woodworking  creating one of the kind tables.  He is also hoping to craft some wooden bowls as well.  The client stated he felt much better talking through his ideas.  There was also helpful to identify his anxiety connected to the vaccination.  Interventions: Motivational Interviewing, Solution-Oriented/Positive Psychology and Insight-Oriented  Diagnosis:   ICD-10-CM   1. Adjustment disorder with anxiety  F43.22     Plan: Positive self talk, engaged activities, radical acceptance, assertiveness, self-care, social network.  Zerek Litsey,  [facility]

## 2020-06-23 ENCOUNTER — Other Ambulatory Visit: Payer: Self-pay

## 2020-06-23 ENCOUNTER — Encounter: Payer: Self-pay | Admitting: Psychiatry

## 2020-06-23 ENCOUNTER — Ambulatory Visit (INDEPENDENT_AMBULATORY_CARE_PROVIDER_SITE_OTHER): Payer: BC Managed Care – PPO | Admitting: Psychiatry

## 2020-06-23 DIAGNOSIS — R454 Irritability and anger: Secondary | ICD-10-CM

## 2020-06-23 DIAGNOSIS — F4310 Post-traumatic stress disorder, unspecified: Secondary | ICD-10-CM

## 2020-06-23 DIAGNOSIS — F39 Unspecified mood [affective] disorder: Secondary | ICD-10-CM | POA: Diagnosis not present

## 2020-06-23 DIAGNOSIS — F5105 Insomnia due to other mental disorder: Secondary | ICD-10-CM | POA: Diagnosis not present

## 2020-06-23 DIAGNOSIS — Z8659 Personal history of other mental and behavioral disorders: Secondary | ICD-10-CM

## 2020-06-23 DIAGNOSIS — F411 Generalized anxiety disorder: Secondary | ICD-10-CM

## 2020-06-23 MED ORDER — SERTRALINE HCL 100 MG PO TABS: 150.0000 mg | ORAL_TABLET | Freq: Every day | ORAL | 1 refills | Status: DC

## 2020-06-23 MED ORDER — OXCARBAZEPINE 300 MG PO TABS: ORAL_TABLET | ORAL | 0 refills | Status: DC

## 2020-06-23 MED ORDER — MIRTAZAPINE 15 MG PO TABS: 7.5000 mg | ORAL_TABLET | Freq: Every day | ORAL | 0 refills | Status: DC

## 2020-06-23 NOTE — Progress Notes (Signed)
Jose [name] [id number] [birth year] 49 y.o.  Subjective:   Patient ID:  Jose [name] is a 49 y.o. (DOB [birth year]) male.  Chief Complaint:  Chief Complaint  Patient presents with  . Follow-up    mood and meds  . Anxiety    HPI Jose [name] presents to the office today for follow-up of anger, irritability and anxiety.  At visit in April 23.  Started sertraline 50 mg and reduced lamotrigine to 100 mg daily.  Still irritable but better control of the anger.    When seen in June 2020.  For continued symptoms oxcarbazepine was increased to 900 mg daily.  He was weaned off lamotrigine because it was not felt to be very helpful.  He continued mirtazapine for sleep.  He continued low-dose sertraline for anxiety because Initially sertraline at HS caused insomnia and wired him. Up.   Sertraline  Change to 100 mg on March 06, 2019.   seen January 2021.Marland Kitchen  No meds were changed.   He called December 12, 2019 wanting to increase medication.  At that time his wife told staff that he was more agitated and angry.  He had discussed this with his therapist [name] and they agreed a med change might be helpful.  He was instructed to increase Trileptal with an additional 300 mg daily.  Also to move his appointment up.  As of appointment December 20, 2019 the following is noted:  Seen with wife, [name]. A lot going on and a lot of stress and demands.  Admits chronic anger problems in work environment.  Can't get away with anger outbursts anymore.  Hard life the way he and sister raised.  W said he's perfectionistic and a workaholic.   Increased Trileptal to 900 mg from 600 mg daily and it's slowed him down and reduced the racing thoughts.  No SE to it. She says anger was worse with added stress at work.  Gets foul language and obsesses on minor things and loses his temper.  With the increase it helped significantly by taking more time to think things through and less agitated. Sleep is great  Leave it like  it is.  Doing pretty good. Mirtazapine works for sleep.  No med changes since here. Has cont counseling.  Wife said leave everything alone and doing well.  Still problems with low sex drive.  Still doesn't really want  Him to change but ambivalent.  Can still get anger but handles it better and lets it go easier.    Can let go of anger quicker.  Still doing well with it unless provoked at work.  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   Wife noticed I don't hold onto it.   still get mad but handling it better.  Been angry all his life.  Grew up in a bad situation with bipolar, schizophrenic with hospitalizations as a kid.  Has a farm with equipment.  Anxiety is high bc involved in a project.  Still jump on people including father and sister yesterday.  I've done well for myself and I tolerate very little.  But is generous.   Probably way worse than he really lets on.  I got issues. sex drive now better with sertraline. Plan no med changes  06/23/2020 appointment with following noted: Good overall but bad days occ.  Less than 1 bad day weekly.   Is having some anxiety but feels like it's due to being so  busy with so many things going on.  Sunday was a bad day. Nervous about Covid shot and hasn't gotten it.  Wife has gotten the first.   Still can be easily agitated.  Doesn't go anywhere and little social interaction.   2 mos from selling the dairy.  He's been kind of out of the business for 3 years bc of his temper. Still have active sex life and handles sertraline better than initially.  Patient reports stable mood and denies depressed but is irritable moods.  He has struggled with anger all his life and does not believe that we will ever fully resolved.  Patient denies difficulty with sleep initiation or maintenance. Sleep like a baby.  Denies appetite disturbance.  Patient reports that energy and motivation have been good.  Patient denies any difficulty with  concentration.  Patient denies any suicidal ideation.  On [place] obsessive-compulsive inventory he answered yes to obsessive thoughts 1 through 5, no to 6 and 7 yes to 7 and 8, noted #10 yes to 1112 and 13, no 214 1516, yes to 17, no to 1819, yesterday #20.  On part B he answered to his extreme to questions 124 and 5 and little control of #3.  No history of D&A abuse. Always liked to eat.  Past Psychiatric Medication Trials: Depakote, lamotrigine, Equetro, Trileptal 900tal ,  Latuda 20, Sertraline 100 ,   fluvoxamine  Review of Systems:  Review of Systems  Cardiovascular: Negative for palpitations.  Neurological: Negative for tremors, weakness and headaches.    Medications: I have reviewed the patient's current medications.  Current Outpatient Medications  Medication Sig Dispense Refill  . amLODipine (NORVASC) 5 MG tablet 5 mg.    . Ascorbic Acid (VITAMIN C) 100 MG tablet Take 100 mg by mouth daily.    . Cholecalciferol (VITAMIN D3) 25 MCG (1000 UT) CAPS Take by mouth.    . esomeprazole (NEXIUM) 20 MG capsule Take 20 mg by mouth daily.     . fexofenadine (ALLEGRA) 180 MG tablet Take 180 mg by mouth daily as needed for allergies.     Marland Kitchen HYDROCHLOROTHIAZIDE PO Take 25 mg by mouth daily.     Marland Kitchen losartan (COZAAR) 100 MG tablet Take 100 mg by mouth daily.     . mirtazapine (REMERON) 15 MG tablet Take 0.5-1 tablets (7.5-15 mg total) by mouth at bedtime. 90 tablet 0  . Omega-3 Fatty Acids (FISH OIL) 1000 MG CAPS Take by mouth.    . Oxcarbazepine (TRILEPTAL) 300 MG tablet Take 1 tablet (300 mg) by mouth in the morning and 2 tablets (600 mg) at bedtime. 270 tablet 0  . sertraline (ZOLOFT) 100 MG tablet Take 1.5 tablets (150 mg total) by mouth daily. 135 tablet 1  . tamsulosin (FLOMAX) 0.4 MG CAPS capsule Take 1 capsule (0.4 mg total) by mouth 2 (two) times daily. 180 capsule 3   No current facility-administered medications for this visit.    Medication Side Effects:  Marked sexual SE.   Looser BM   Allergies:  Allergies  Allergen Reactions  . Shellfish Allergy Anaphylaxis  . Penicillins     Unknown, childhood reaction Has patient had a PCN reaction causing immediate rash, facial/tongue/throat swelling, SOB or lightheadedness with hypotension: Unknown Has patient had a PCN reaction causing severe rash involving mucus membranes or skin necrosis: Unknown Has patient had a PCN reaction that required hospitalization No Has patient had a PCN reaction occurring within the last 10 years: No If all of the above  answers are "NO", then may proceed with Cephalosporin use.     Past Medical History:  Diagnosis Date  . Diverticulitis   . GERD (gastroesophageal reflux disease)   . Hypertension   . Seasonal allergies     Family History  Problem Relation Age of Onset  . Heart disease Mother   . Diabetes Mother   . Cancer Father        prostate  . Heart disease Unknown   . Cancer Unknown     Social History   Socioeconomic History  . Marital status: Married    Spouse name: [name]  . Number of children: 2  . Years of education: 21  . Highest education level: Not on file  Occupational History    Employer: [employer]    Comment: [employer]  Tobacco Use  . Smoking status: Never Smoker  . Smokeless tobacco: Never Used  Substance and Sexual Activity  . Alcohol use: No  . Drug use: No  . Sexual activity: Not on file  Other Topics Concern  . Not on file  Social History Narrative  . Not on file   Social Determinants of Health   Financial Resource Strain:   . Difficulty of Paying Living Expenses: Not on file  Food Insecurity:   . Worried About Charity fundraiser in the Last Year: Not on file  . Ran Out of Food in the Last Year: Not on file  Transportation Needs:   . Lack of Transportation (Medical): Not on file  . Lack of Transportation (Non-Medical): Not on file  Physical Activity:   . Days of Exercise per Week: Not on file  . Minutes of Exercise per Session:  Not on file  Stress:   . Feeling of Stress : Not on file  Social Connections:   . Frequency of Communication with Friends and Family: Not on file  . Frequency of Social Gatherings with Friends and Family: Not on file  . Attends Religious Services: Not on file  . Active Member of Clubs or Organizations: Not on file  . Attends Archivist Meetings: Not on file  . Marital Status: Not on file  Intimate Partner Violence:   . Fear of Current or Ex-Partner: Not on file  . Emotionally Abused: Not on file  . Physically Abused: Not on file  . Sexually Abused: Not on file    Past Medical History, Surgical history, Social history, and Family history were reviewed and updated as appropriate.   Please see review of systems for further details on the patient's review from today.   Objective:   Physical Exam:  There were no vitals taken for this visit.  Physical Exam Constitutional:      General: He is not in acute distress.    Appearance: He is well-developed. He is obese.  Musculoskeletal:        General: No deformity.  Neurological:     Mental Status: He is alert and oriented to person, place, and time.     Coordination: Coordination normal.  Psychiatric:        Attention and Perception: Attention normal. He is attentive.        Mood and Affect: Mood is anxious. Mood is not depressed. Affect is not labile, blunt, angry or inappropriate.        Speech: Speech normal. Speech is not rapid and pressured.        Behavior: Behavior normal. Behavior is not agitated or aggressive.  Thought Content: Thought content normal. Thought content does not include homicidal or suicidal ideation. Thought content does not include homicidal or suicidal plan.        Cognition and Memory: Cognition normal.        Judgment: Judgment normal.     Comments: Insight is fair.  He still has irritability but was certainly appropriate in the office.      Lab Review:     Component Value Date/Time    NA 144 03/12/2012 1100   K 3.6 03/12/2012 1100   CL 104 03/12/2012 1100   CO2 29 03/12/2012 1100   GLUCOSE 108 (H) 03/12/2012 1100   BUN 15 03/12/2012 1100   CREATININE 0.83 03/12/2012 1100   CALCIUM 10.5 03/12/2012 1100   PROT 6.4 10/13/2010 2037   ALBUMIN 4.7 10/13/2010 2037   AST 16 10/13/2010 2037   ALT 19 10/13/2010 2037   ALKPHOS 57 10/13/2010 2037   BILITOT 1.4 (H) 10/13/2010 2037   GFRNONAA >90 03/12/2012 1100   GFRAA >90 03/12/2012 1100       Component Value Date/Time   WBC 10.0 09/27/2010 1417   RBC 5.24 09/27/2010 1417   HGB 16.4 03/12/2012 1100   HCT 45.6 03/12/2012 1100   PLT 218 09/27/2010 1417   MCV 84.2 09/27/2010 1417   MCH 30.2 09/27/2010 1417   MCHC 35.8 09/27/2010 1417   RDW 12.9 09/27/2010 1417   LYMPHSABS 2.9 09/27/2010 1417   MONOABS 0.5 09/27/2010 1417   EOSABS 0.3 09/27/2010 1417   BASOSABS 0.1 09/27/2010 1417    No results found for: POCLITH, LITHIUM   No results found for: PHENYTOIN, PHENOBARB, VALPROATE, CBMZ   .res Assessment: Plan:    Gen was seen today for follow-up and anxiety.  Diagnoses and all orders for this visit:  PTSD (post-traumatic stress disorder) -     sertraline (ZOLOFT) 100 MG tablet; Take 1.5 tablets (150 mg total) by mouth daily.  Episodic mood disorder (HCC) -     Oxcarbazepine (TRILEPTAL) 300 MG tablet; Take 1 tablet (300 mg) by mouth in the morning and 2 tablets (600 mg) at bedtime. -     sertraline (ZOLOFT) 100 MG tablet; Take 1.5 tablets (150 mg total) by mouth daily.  Difficulty controlling anger  Insomnia due to mental condition -     mirtazapine (REMERON) 15 MG tablet; Take 0.5-1 tablets (7.5-15 mg total) by mouth at bedtime.  History of bipolar disorder  Generalized anxiety disorder -     sertraline (ZOLOFT) 100 MG tablet; Take 1.5 tablets (150 mg total) by mouth daily.     History of bipolar disorder versus major depression Rule out intermittent explosive disorder Rule out OCD variant  but it seems to just focus on perfectionism.  Greater than 50% of face to face time with patient was spent on counseling and coordination of care. We discussed Patient describes history of perfectionism and emotional intensity that sometimes leads to outbursts of anger.  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  Has lost friendships over anger outbursts.  Has worked on it in therapy. Unclear about a  distinct pattern of cyclic mania but the meds that have been used are meds that are often used to treat anger outbursts effectively.   However he and his wife feel that his anxiety which sometimes leads to anger is no longer adequately managed.   It has caused problems and family relationships recently.  Continue oxcarbazepine 300 mg AM and 600 mg PM  Increase sertraline 150 mg daily to further reduce the anxiety.  He clearly saw additional benefit with the increase in sertraline and that he is less ruminative on his anger.  He is open to the possibility of increasing this as an alternative to increasing the oxcarbazepine if is not effective enough.  Sexual side effects have resolved sertraline does more for him than any other med he's taking. So relieved to be in control of his anger and doesn't want to change it.    Recent PE unremarkable.  The mirtazapine is effective for his sleep at one half of a 15 mg tablet so we will not change that.  Continue counseling as needed he is.  He is found that helpful  Follow-up 3-mos  Lynder Parents, MD, DFAPA    Future Appointments  Date Time Provider [place]  07/02/2020  9:00 AM May, [place], [facility]CP None  07/16/2020  9:00 AM May, [place], [facility] CP[facility]CP None  08/06/2020  9:00 AM May, [place], [facility] CP-CP None  08/20/2020  9:00 AM May, [place], [facility] CP-CP None  09/03/2020  9:00 AM May, [place], [facility] CP-CP None  10/20/2020 10:45 AM Franchot Gallo, MD [facility] None    No orders of the defined types were  placed in this encounter.   -------------------------------

## 2020-07-02 ENCOUNTER — Ambulatory Visit (INDEPENDENT_AMBULATORY_CARE_PROVIDER_SITE_OTHER): Payer: BC Managed Care – PPO | Admitting: Psychiatry

## 2020-07-02 ENCOUNTER — Encounter: Payer: Self-pay | Admitting: Psychiatry

## 2020-07-02 ENCOUNTER — Other Ambulatory Visit: Payer: Self-pay

## 2020-07-02 DIAGNOSIS — F4322 Adjustment disorder with anxiety: Secondary | ICD-10-CM | POA: Diagnosis not present

## 2020-07-02 NOTE — Progress Notes (Signed)
[facility] Counselor/Therapist Progress Note  Patient ID: Jose [name], MRN: [id number],    Date: 07/02/2020  Time Spent: 50 minutes   Treatment Type: Individual Therapy  Reported Symptoms: anxiety  Mental Status Exam:  Appearance:   Well Groomed     Behavior:  Appropriate  Motor:  Normal  Speech/Language:   Clear and Coherent  Affect:  Appropriate  Mood:  anxious  Thought process:  normal  Thought content:    WNL  Sensory/Perceptual disturbances:    WNL  Orientation:  oriented to person, place, time/date and situation  Attention:  Good  Concentration:  Good  Memory:  WNL  Fund of knowledge:   Good  Insight:    Good  Judgment:   Good  Impulse Control:  Good   Risk Assessment: Danger to Self:  No Self-injurious Behavior: No Danger to Others: No Duty to Warn:no Physical Aggression / Violence:No  Access to Firearms a concern: No  Gang Involvement:No   Subjective: The client stated that he saw [name] last week.  He stated his sertraline was increased to 100 mg.  That seems to be going well for the client.  He feels like he recently picked up a cold.  "I am stressed.  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  The house I built just got sheet rocked.  I am close to finishing the wedding venue.".  The client feels very anxious.  Today I used eye-movement with the client focusing on this issue.  His negative cognition is, "I am overwhelmed."  He feels anxiety all over.  His subjective units of distress is a 10+.  As the client processed he had a little difficulty thinking and tracking my fingers at the same time.  He did notice that his anxiety went down.  I asked the client if he was putting too much pressure on himself?  He stated that he was.  I suggested that his pressure to be perfect and to get a lot done as possible comes out of his childhood so that he could measure up?  The client agreed that this could be true as well.  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  4) mowed for 4 hours on the small tractor and use the blower.  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  As the client continued to process he noticed that his anxiety reduced significantly.  Once it got down to a subjective units of distress of 3 the client asked if we could do it again.  His subjective units of distress ended up at less than 1.  The client was so surprised that his anxiety was as low as it was.  His positive cognition at the end of the session was, "I can do it."  This way the client does not have to pressure himself to get things done because he knows he will do it.  Interventions: Motivational Interviewing, Solution-Oriented/Positive Psychology, [employer] [facility] Desensitization and Reprocessing (EMDR) and Insight-Oriented  Diagnosis:   ICD-10-CM   1. Adjustment disorder with anxiety  F43.22     Plan: Mood independent behavior, positive self talk, self-care, assertiveness, boundaries, acceptance  Clarita Leber, [facility]

## 2020-07-16 ENCOUNTER — Ambulatory Visit: Payer: BC Managed Care – PPO | Admitting: Psychiatry

## 2020-08-06 ENCOUNTER — Encounter: Payer: Self-pay | Admitting: Psychiatry

## 2020-08-06 ENCOUNTER — Ambulatory Visit (INDEPENDENT_AMBULATORY_CARE_PROVIDER_SITE_OTHER): Payer: BC Managed Care – PPO | Admitting: Psychiatry

## 2020-08-06 ENCOUNTER — Other Ambulatory Visit: Payer: Self-pay

## 2020-08-06 DIAGNOSIS — F4322 Adjustment disorder with anxiety: Secondary | ICD-10-CM | POA: Diagnosis not present

## 2020-08-06 NOTE — Progress Notes (Signed)
      [facility] Counselor/Therapist Progress Note  Patient ID: Jose [name], MRN: [id number],    Date: 08/06/2020  Time Spent: 50 minutes   Treatment Type: Individual Therapy  Reported Symptoms: anxiety  Mental Status Exam:  Appearance:   Well Groomed     Behavior:  Appropriate  Motor:  Normal  Speech/Language:   Clear and Coherent  Affect:  Appropriate  Mood:  anxious  Thought process:  normal  Thought content:    WNL  Sensory/Perceptual disturbances:    WNL  Orientation:  oriented to person, place, time/date and situation  Attention:  Good  Concentration:  Good  Memory:  WNL  Fund of knowledge:   Good  Insight:    Good  Judgment:   Good  Impulse Control:  Good   Risk Assessment: Danger to Self:  No Self-injurious Behavior: No Danger to Others: No Duty to Warn:no Physical Aggression / Violence:No  Access to Firearms a concern: No  Gang Involvement:No   Subjective: The client states that he got his first COVID-19 shot.  He is scheduled for his next one for Tuesday of next week.  He stated it was helpful to talk with [name] about the vaccination.  [name] made the point, "you can either get the shot or get the virus it will be one or the other."  This makes good sense to the client and he feels good about the vaccination. The client has finished the wedding venue on their property and had their first event last week.  He felt it went very well and he was proud of how everything turned out.  Later in the week he was cleaning up leaves out of his yard.  A man in a burgundy truck pulled up to his house and asked him if he could put a sign in his yard in exchange for painting the roofs of his outbuildings.  The client said he could not do anything until he spoke with his wife.  He stated that this man was very persistent as they walked around his property.  He kept pressing to offer to paint one of his buildings but the client persistently told him no as well.  The  man left his card with the client.  Later that evening he googled the phone number and found that it was connected to the community of "[place] travelers".  This particular man had just finished federal time for racketeering, getting cars under false pretenses and fraud.  The client contacted his local law enforcement to alert them to this.  The client stated that he had some anxiety through all this but felt good about his final decision.  He gets concerned about bad things happening on his property.  He was surprised that this man was so bold.  He felt positive that he stayed focused and did not let this person overrun his boundaries.  Interventions: Assertiveness/Communication, Motivational Interviewing, Solution-Oriented/Positive Psychology and Insight-Oriented  Diagnosis:   ICD-10-CM   1. Adjustment disorder with anxiety  F43.22     Plan: Pending behavior, boundaries, self-care, assertiveness, follow-up with law enforcement.  Myca Perno, [facility]

## 2020-08-20 ENCOUNTER — Encounter: Payer: Self-pay | Admitting: Psychiatry

## 2020-08-20 ENCOUNTER — Ambulatory Visit (INDEPENDENT_AMBULATORY_CARE_PROVIDER_SITE_OTHER): Payer: BC Managed Care – PPO | Admitting: Psychiatry

## 2020-08-20 ENCOUNTER — Other Ambulatory Visit: Payer: Self-pay

## 2020-08-20 DIAGNOSIS — F4322 Adjustment disorder with anxiety: Secondary | ICD-10-CM

## 2020-08-20 NOTE — Progress Notes (Signed)
      [facility] Counselor/Therapist Progress Note  Patient ID: RALSTON VENUS, MRN: [id number],    Date: 08/20/2020  Time Spent: 50 minutes   Treatment Type: Individual Therapy  Reported Symptoms: anxiety  Mental Status Exam:  Appearance:   Well Groomed     Behavior:  Appropriate  Motor:  Normal  Speech/Language:   Clear and Coherent  Affect:  Appropriate  Mood:  anxious  Thought process:  normal  Thought content:    WNL  Sensory/Perceptual disturbances:    WNL  Orientation:  oriented to person, place, time/date and situation  Attention:  Good  Concentration:  Good  Memory:  WNL  Fund of knowledge:   Good  Insight:    Good  Judgment:   Good  Impulse Control:  Good   Risk Assessment: Danger to Self:  No Self-injurious Behavior: No Danger to Others: No Duty to Warn:no Physical Aggression / Violence:No  Access to Firearms a concern: No  Gang Involvement:No   Subjective: The client states that 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  It is still coming down to early January for the sale of the business.  The client has some anxiety about it closing.  He has not worked in the dairy because of the stress and anxiety it causes him.  He can become so overstressed that he loses his temper.  The client has found this to be counterproductive.  Since being on medication he has done much better.  He has also done a better job in developing radical acceptance with his current circumstances. He has been harvesting some old growth trees on his property in [place].  His dad was helping him cut a tree down.  When his dad got the tree down, it fell on the client's major outbuilding.  The client responded remarkably well.  He was not upset with his father and extended quite a bit of grace to him.  This is a marked improvement from what he would have been doing last year.  He agrees.  He has tried to spend more  time with his dad with whom he has a complicated relationship with.  His dad has never really fathered him or give him much in the way of approval.  He said recently he and his dad have had lunch almost every day.  This is been very positive for the client since there was so many years of poor relationship.  He continues to intersect with his dad where he can.  Interventions: Assertiveness/Communication, Motivational Interviewing, Solution-Oriented/Positive Psychology and Insight-Oriented  Diagnosis:   ICD-10-CM   1. Adjustment disorder with anxiety  F43.22     Plan: Mood independent behavior, positive self talk, self-care, assertiveness, boundaries, radical acceptance.  Janaisa Birkland, [facility]

## 2020-09-03 ENCOUNTER — Ambulatory Visit: Payer: BC Managed Care – PPO | Admitting: Psychiatry

## 2020-09-24 ENCOUNTER — Encounter: Payer: Self-pay | Admitting: Psychiatry

## 2020-09-24 ENCOUNTER — Other Ambulatory Visit: Payer: Self-pay

## 2020-09-24 ENCOUNTER — Ambulatory Visit (INDEPENDENT_AMBULATORY_CARE_PROVIDER_SITE_OTHER): Payer: BC Managed Care – PPO | Admitting: Psychiatry

## 2020-09-24 DIAGNOSIS — F411 Generalized anxiety disorder: Secondary | ICD-10-CM

## 2020-09-24 DIAGNOSIS — F39 Unspecified mood [affective] disorder: Secondary | ICD-10-CM | POA: Diagnosis not present

## 2020-09-24 DIAGNOSIS — F5105 Insomnia due to other mental disorder: Secondary | ICD-10-CM | POA: Diagnosis not present

## 2020-09-24 DIAGNOSIS — F4310 Post-traumatic stress disorder, unspecified: Secondary | ICD-10-CM | POA: Diagnosis not present

## 2020-09-24 DIAGNOSIS — R454 Irritability and anger: Secondary | ICD-10-CM | POA: Diagnosis not present

## 2020-09-24 MED ORDER — SERTRALINE HCL 100 MG PO TABS
200.0000 mg | ORAL_TABLET | Freq: Every day | ORAL | 1 refills | Status: DC
Start: 2020-09-24 — End: 2020-12-23

## 2020-09-24 MED ORDER — OXCARBAZEPINE 300 MG PO TABS
ORAL_TABLET | ORAL | 1 refills | Status: DC
Start: 2020-09-24 — End: 2020-12-23

## 2020-09-24 NOTE — Progress Notes (Signed)
Jose [name] [id number] [birth year] 50 y.o.  Subjective:   Patient ID:  Jose [name] is a 50 y.o. (DOB 03/02/1971) male.  Chief Complaint:  Chief Complaint  Patient presents with  . Follow-up    anger  . Anxiety    HPI Jose [name] presents to the office today for follow-up of anger, irritability and anxiety.  At visit in April 23.  Started sertraline 50 mg and reduced lamotrigine to 100 mg daily.  Still irritable but better control of the anger.    When seen in June 2020.  For continued symptoms oxcarbazepine was increased to 900 mg daily.  He was weaned off lamotrigine because it was not felt to be very helpful.  He continued mirtazapine for sleep.  He continued low-dose sertraline for anxiety because Initially sertraline at HS caused insomnia and wired him. Up.   Sertraline  Change to 100 mg on March 06, 2019.   seen January 2021.Marland Kitchen  No meds were changed.   He called December 12, 2019 wanting to increase medication.  At that time his wife told staff that he was more agitated and angry.  He had discussed this with his therapist [name] and they agreed a med change might be helpful.  He was instructed to increase Trileptal with an additional 300 mg daily.  Also to move his appointment up.  As of appointment December 20, 2019 the following is noted:  Seen with wife, Jose [name]. A lot going on and a lot of stress and demands.  Admits chronic anger problems in work environment.  Can't get away with anger outbursts anymore.  Hard life the way he and sister raised.  W said he's perfectionistic and a workaholic.   Increased Trileptal to 900 mg from 600 mg daily and it's slowed him down and reduced the racing thoughts.  No SE to it. She says anger was worse with added stress at work.  Gets foul language and obsesses on minor things and loses his temper.  With the increase it helped significantly by taking more time to think things through and less agitated. Sleep is great  Leave it like it is.   Doing pretty good. Mirtazapine works for sleep.  No med changes since here. Has cont counseling.  Wife said leave everything alone and doing well.  Still problems with low sex drive.  Still doesn't really want  Him to change but ambivalent.  Can still get anger but handles it better and lets it go easier.    Can let go of anger quicker.  Still doing well with it unless provoked at work.  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   Wife noticed I don't hold onto it.   still get mad but handling it better.  Been angry all his life.  Grew up in a bad situation with bipolar, schizophrenic with hospitalizations as a kid.  Has a farm with equipment.  Anxiety is high bc involved in a project.  Still jump on people including father and sister yesterday.  I've done well for myself and I tolerate very little.  But is generous.   Probably way worse than he really lets on.  I got issues. sex drive now better with sertraline. Plan no med changes  06/23/2020 appointment with following noted: Good overall but bad days occ.  Less than 1 bad day weekly.   Is having some anxiety but feels like it's due to being so busy with  so many things going on.  Sunday was a bad day. Nervous about Covid shot and hasn't gotten it.  Wife has gotten the first.   Still can be easily agitated.  Doesn't go anywhere and little social interaction.   2 mos from selling the dairy.  He's been kind of out of the business for 3 years bc of his temper. Still have active sex life and handles sertraline better than initially. Plan: Increase sertraline 150 mg daily to further reduce the anxiety.    09/24/20 appt noted:  Ill today and mad at the world.  Hasn't had a bad day in awhile.  But today could fight the world.  Has to do with owning a business.  Been like that my whole life.   I don't think straight when I'm mad.  Overall it is better than it was before the increase in sertraline.  SE early sex SE resolved.      Would like to try a higher dose to see if it could be better. Xmas has never good and this was best Xmas ever. Sleep still good.  Patient reports stable mood and denies depressed but is irritable moods.  He has struggled with anger all his life and does not believe that we will ever fully resolved.  Patient denies difficulty with sleep initiation or maintenance. Sleep like a baby.  Denies appetite disturbance.  Patient reports that energy and motivation have been good.  Patient denies any difficulty with concentration.  Patient denies any suicidal ideation.  On [place] obsessive-compulsive inventory he answered yes to obsessive thoughts 1 through 5, no to 6 and 7 yes to 7 and 8, noted #10 yes to 1112 and 13, no 214 1516, yes to 17, no to 1819, yesterday #20.  On part B he answered to his extreme to questions 124 and 5 and little control of #3.  No history of D&A abuse. Always liked to eat.  Past Psychiatric Medication Trials: Depakote, lamotrigine, Equetro, Trileptal 900tal ,  Latuda 20, Sertraline 150 ,   fluvoxamine  Review of Systems:  Review of Systems  Cardiovascular: Negative for palpitations.  Neurological: Negative for tremors, weakness and headaches.  Psychiatric/Behavioral: Positive for behavioral problems. The patient is not nervous/anxious.     Medications: I have reviewed the patient's current medications.  Current Outpatient Medications  Medication Sig Dispense Refill  . amLODipine (NORVASC) 5 MG tablet 5 mg.    . Ascorbic Acid (VITAMIN C) 100 MG tablet Take 100 mg by mouth daily.    . Cholecalciferol (VITAMIN D3) 25 MCG (1000 UT) CAPS Take by mouth.    . esomeprazole (NEXIUM) 20 MG capsule Take 20 mg by mouth daily.     . fexofenadine (ALLEGRA) 180 MG tablet Take 180 mg by mouth daily as needed for allergies.     Marland Kitchen HYDROCHLOROTHIAZIDE PO Take 25 mg by mouth daily.     Marland Kitchen losartan (COZAAR) 100 MG tablet Take 100 mg by mouth daily.     . mirtazapine (REMERON) 15 MG tablet  Take 0.5-1 tablets (7.5-15 mg total) by mouth at bedtime. 90 tablet 0  . Omega-3 Fatty Acids (FISH OIL) 1000 MG CAPS Take by mouth.    . tamsulosin (FLOMAX) 0.4 MG CAPS capsule Take 1 capsule (0.4 mg total) by mouth 2 (two) times daily. 180 capsule 3  . Oxcarbazepine (TRILEPTAL) 300 MG tablet Take 1 tablet (300 mg) by mouth in the morning and 2 tablets (600 mg) at bedtime. 270 tablet 1  .  sertraline (ZOLOFT) 100 MG tablet Take 2 tablets (200 mg total) by mouth daily. 180 tablet 1   No current facility-administered medications for this visit.    Medication Side Effects:  Marked sexual SE.  Looser BM   Allergies:  Allergies  Allergen Reactions  . Shellfish Allergy Anaphylaxis  . Penicillins     Unknown, childhood reaction Has patient had a PCN reaction causing immediate rash, facial/tongue/throat swelling, SOB or lightheadedness with hypotension: Unknown Has patient had a PCN reaction causing severe rash involving mucus membranes or skin necrosis: Unknown Has patient had a PCN reaction that required hospitalization No Has patient had a PCN reaction occurring within the last 10 years: No If all of the above answers are "NO", then may proceed with Cephalosporin use.     Past Medical History:  Diagnosis Date  . Diverticulitis   . GERD (gastroesophageal reflux disease)   . Hypertension   . Seasonal allergies     Family History  Problem Relation Age of Onset  . Heart disease Mother   . Diabetes Mother   . Cancer Father        prostate  . Heart disease Unknown   . Cancer Unknown     Social History   Socioeconomic History  . Marital status: Married    Spouse name: Jose [name]  . Number of children: 2  . Years of education: 51  . Highest education level: Not on file  Occupational History    Employer: [employer]    Comment: [employer]  Tobacco Use  . Smoking status: Never Smoker  . Smokeless tobacco: Never Used  Substance and Sexual Activity  . Alcohol use: No  . Drug use: No   . Sexual activity: Not on file  Other Topics Concern  . Not on file  Social History Narrative  . Not on file   Social Determinants of Health   Financial Resource Strain: Not on file  Food Insecurity: Not on file  Transportation Needs: Not on file  Physical Activity: Not on file  Stress: Not on file  Social Connections: Not on file  Intimate Partner Violence: Not on file    Past Medical History, Surgical history, Social history, and Family history were reviewed and updated as appropriate.   Please see review of systems for further details on the patient's review from today.   Objective:   Physical Exam:  There were no vitals taken for this visit.  Physical Exam Constitutional:      General: He is not in acute distress.    Appearance: He is well-developed. He is obese.  Musculoskeletal:        General: No deformity.  Neurological:     Mental Status: He is alert and oriented to person, place, and time.     Coordination: Coordination normal.  Psychiatric:        Attention and Perception: Attention normal. He is attentive.        Mood and Affect: Mood is anxious. Mood is not depressed. Affect is not labile, blunt, angry or inappropriate.        Speech: Speech normal. Speech is not rapid and pressured.        Behavior: Behavior normal. Behavior is not agitated or aggressive.        Thought Content: Thought content normal. Thought content does not include homicidal or suicidal ideation. Thought content does not include homicidal or suicidal plan.        Cognition and Memory: Cognition normal.  Judgment: Judgment normal.     Comments: Insight is fair.  He still has irritability but was certainly appropriate in the office.  Today is a bad day.      Lab Review:     Component Value Date/Time   NA 144 03/12/2012 1100   K 3.6 03/12/2012 1100   CL 104 03/12/2012 1100   CO2 29 03/12/2012 1100   GLUCOSE 108 (H) 03/12/2012 1100   BUN 15 03/12/2012 1100   CREATININE  0.83 03/12/2012 1100   CALCIUM 10.5 03/12/2012 1100   PROT 6.4 10/13/2010 2037   ALBUMIN 4.7 10/13/2010 2037   AST 16 10/13/2010 2037   ALT 19 10/13/2010 2037   ALKPHOS 57 10/13/2010 2037   BILITOT 1.4 (H) 10/13/2010 2037   GFRNONAA >90 03/12/2012 1100   GFRAA >90 03/12/2012 1100       Component Value Date/Time   WBC 10.0 09/27/2010 1417   RBC 5.24 09/27/2010 1417   HGB 16.4 03/12/2012 1100   HCT 45.6 03/12/2012 1100   PLT 218 09/27/2010 1417   MCV 84.2 09/27/2010 1417   MCH 30.2 09/27/2010 1417   MCHC 35.8 09/27/2010 1417   RDW 12.9 09/27/2010 1417   LYMPHSABS 2.9 09/27/2010 1417   MONOABS 0.5 09/27/2010 1417   EOSABS 0.3 09/27/2010 1417   BASOSABS 0.1 09/27/2010 1417    No results found for: POCLITH, LITHIUM   No results found for: PHENYTOIN, PHENOBARB, VALPROATE, CBMZ   .res Assessment: Plan:    Atari was seen today for follow-up and anxiety.  Diagnoses and all orders for this visit:  Episodic mood disorder (HCC) -     sertraline (ZOLOFT) 100 MG tablet; Take 2 tablets (200 mg total) by mouth daily. -     Oxcarbazepine (TRILEPTAL) 300 MG tablet; Take 1 tablet (300 mg) by mouth in the morning and 2 tablets (600 mg) at bedtime.  PTSD (post-traumatic stress disorder) -     sertraline (ZOLOFT) 100 MG tablet; Take 2 tablets (200 mg total) by mouth daily.  Insomnia due to mental condition  Difficulty controlling anger  Generalized anxiety disorder -     sertraline (ZOLOFT) 100 MG tablet; Take 2 tablets (200 mg total) by mouth daily.     History of bipolar disorder versus major depression Rule out intermittent explosive disorder Rule out OCD variant but it seems to just focus on perfectionism.  Greater than 50% of face to face time with patient was spent on counseling and coordination of care. We discussed Patient describes history of perfectionism and emotional intensity that sometimes leads to outbursts of anger.  Amplified by the fact that he works in a  family business.  Has lost friendships over anger outbursts.  Has worked on it in therapy. Unclear about a  distinct pattern of cyclic mania but the meds that have been used are meds that are often used to treat anger outbursts effectively.   However he and his wife feel that his anxiety which sometimes leads to anger is no longer adequately managed.   It has caused problems and family relationships recently.  Continue oxcarbazepine 300 mg AM and 600 mg PM  Increase sertraline 200 mg daily to further reduce the anxiety.  He clearly saw additional benefit with the increase in sertraline and that he is less ruminative on his anger.  He is open to the possibility of increasing this as an alternative to increasing the oxcarbazepine if is not effective enough.  Sexual side effects have resolved sertraline does  more for him than any other med he's taking. So relieved to be in control of his anger and doesn't want to change it.    Recent PE unremarkable.  The mirtazapine is effective for his sleep at one half of a 15 mg tablet so we will not change that.  Continue counseling as needed he is.  He is found that helpful  Follow-up 3-mos  Jose Parents, MD, DFAPA    Future Appointments  Date Time Provider [place]  09/29/2020  9:00 AM May, [place], [facility] CP-CP None  10/20/2020 10:45 AM Franchot Gallo, MD AUR-AUR None  10/21/2020  8:00 AM May, [place], [facility] CP-CP None  11/11/2020  9:00 AM May, [place], [facility] CP-CP None  12/02/2020 11:00 AM MayAlbertina Parr, [facility] CP-CP None    No orders of the defined types were placed in this encounter.   -------------------------------

## 2020-09-29 ENCOUNTER — Other Ambulatory Visit: Payer: Self-pay

## 2020-09-29 ENCOUNTER — Ambulatory Visit (INDEPENDENT_AMBULATORY_CARE_PROVIDER_SITE_OTHER): Payer: BC Managed Care – PPO | Admitting: Psychiatry

## 2020-09-29 ENCOUNTER — Encounter: Payer: Self-pay | Admitting: Psychiatry

## 2020-09-29 DIAGNOSIS — F4323 Adjustment disorder with mixed anxiety and depressed mood: Secondary | ICD-10-CM | POA: Diagnosis not present
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
## 2020-10-20 ENCOUNTER — Encounter: Payer: Self-pay | Admitting: Urology

## 2020-10-20 ENCOUNTER — Ambulatory Visit (INDEPENDENT_AMBULATORY_CARE_PROVIDER_SITE_OTHER): Payer: BC Managed Care – PPO | Admitting: Urology

## 2020-10-20 ENCOUNTER — Telehealth: Payer: Self-pay

## 2020-10-20 ENCOUNTER — Other Ambulatory Visit: Payer: Self-pay

## 2020-10-20 ENCOUNTER — Ambulatory Visit: Payer: BC Managed Care – PPO | Admitting: Urology

## 2020-10-20 VITALS — BP 158/88 | HR 70 | Temp 98.0°F | Ht 68.0 in | Wt 305.0 lb

## 2020-10-20 DIAGNOSIS — N400 Enlarged prostate without lower urinary tract symptoms: Secondary | ICD-10-CM

## 2020-10-20 LAB — URINALYSIS, ROUTINE W REFLEX MICROSCOPIC
Bilirubin, UA: NEGATIVE
Glucose, UA: NEGATIVE
Ketones, UA: NEGATIVE
Leukocytes,UA: NEGATIVE
Nitrite, UA: NEGATIVE
Protein,UA: NEGATIVE
RBC, UA: NEGATIVE
Specific Gravity, UA: 1.02 (ref 1.005–1.030)
Urobilinogen, Ur: 0.2 mg/dL (ref 0.2–1.0)
pH, UA: 6 (ref 5.0–7.5)

## 2020-10-20 MED ORDER — TAMSULOSIN HCL 0.4 MG PO CAPS: 0.4000 mg | ORAL_CAPSULE | Freq: Two times a day (BID) | ORAL | 3 refills | Status: DC

## 2020-10-20 NOTE — Telephone Encounter (Signed)
-----   Message from Franchot Gallo, MD sent at 10/20/2020  1:44 PM EST ----- Pt needs PSA drawn--he did not have 1 done @ [facility]--I put order in

## 2020-10-20 NOTE — Progress Notes (Signed)
Urological Symptom Review  Patient is experiencing the following symptoms: None   Review of Systems  Gastrointestinal (upper)  : Indigestion/heartburn  Gastrointestinal (lower) : Negative for lower GI symptoms  Constitutional : Negative for symptoms  Skin: Negative for skin symptoms  Eyes: Negative for eye symptoms  Ear/Nose/Throat : Negative for Ear/Nose/Throat symptoms  Hematologic/Lymphatic: Negative for Hematologic/Lymphatic symptoms  Cardiovascular : Negative for cardiovascular symptoms  Respiratory : Negative for respiratory symptoms  Endocrine: Negative for endocrine symptoms  Musculoskeletal: Negative for musculoskeletal symptoms  Neurological: Negative for neurological symptoms  Psychologic: Anxiety

## 2020-10-20 NOTE — Progress Notes (Signed)
H&P  Chief Complaint: BPH w/ LUTS  History of Present Illness:  Jose [name] is a 50 year-old male established patient who is here for symptoms of enlarged prostate.  His symptoms have gotten worse over the last year. He has been treated with Flomax. The patient has never had a surgical procedure for bladder outlet obstruction to his prostate.   10.22.2019: 50 year old male referred by Dr. Rory Percy for evaluation and management of lower urinary tract issues. He does have obstructive symptomatology, with some improvement on tamsulosin which was started a few weeks ago. However, his symptoms have gotten a bit worse recently. There is a family history of prostate cancer. PSA recently was 0.6. He was started on bid dosing of Flomax.   12.3.2019: He returns for follow up. IPSS 10, quality of life 1. No significant side effects from the increased dose of tamsulosin.   2.9.2021: Here today for follow-up reporting significantly improved urinary sx's on 2x tamsulosin daily. He also reports having been a lot more physically active lately and having lost a significant amount of weight -- he attributes his improved urinary sx's to his medication as well as this weight loss. He also notes at today's visit that his father has been treated aggressively for prostate cancer, requiring radical prostatectomy and radiation. He states that this has made him somewhat paranoid about his own risk for PCa. He denies any significant side effects from tamsulosin though does note intermittent retrograde ejaculation (1/10 times), though he is also on zoloft and thinks its side effects may be exacerbating this.   2.15.2022: Jose [name] is here today for his regularly scheduled f/u of his BPH. He continues on tamsulosin (qd) and denies any significant change or worsening of LUTS. He denies any ED at this time.  IPSS Questionnaire (AUA-7): Over the past month.   1)  How often have you had a sensation of not emptying your  bladder completely after you finish urinating?  1 - Less than 1 time in 5  2)  How often have you had to urinate again less than two hours after you finished urinating? 1 - Less than 1 time in 5  3)  How often have you found you stopped and started again several times when you urinated?  0 - Not at all  4) How difficult have you found it to postpone urination?  0 - Not at all  5) How often have you had a weak urinary stream?  1 - Less than 1 time in 5  6) How often have you had to push or strain to begin urination?  0 - Not at all  7) How many times did you most typically get up to urinate from the time you went to bed until the time you got up in the morning?  0 - None  Total score:  0-7 mildly symptomatic   8-19 moderately symptomatic   20-35 severely symptomatic  IPSS: 3 QoL Score: 1   Past Medical History:  Diagnosis Date  . Diverticulitis   . GERD (gastroesophageal reflux disease)   . Hypertension   . Seasonal allergies     Past Surgical History:  Procedure Laterality Date  . CHOLECYSTECTOMY  04/2009  . CHOLECYSTECTOMY    . COLONOSCOPY     by Dr. Kem Boroughs polyps  . ESOPHAGOGASTRODUODENOSCOPY  05/2009   Schatzki's ring, s/p dilatation + eosinophilic  . KNEE ARTHROSCOPY  03/16/2012   Procedure: ARTHROSCOPY KNEE;  Surgeon: Carole Civil, MD;  Location: [facility];  Service: Orthopedics;  Laterality: Left;  drilling and abrasion, arthroplasty of trochlea  . KNEE SURGERY     left  . UMBILICAL HERNIA REPAIR      Home Medications:  Allergies as of 10/20/2020      Reactions   Shellfish Allergy Anaphylaxis   Penicillins    Unknown, childhood reaction Has patient had a PCN reaction causing immediate rash, facial/tongue/throat swelling, SOB or lightheadedness with hypotension: Unknown Has patient had a PCN reaction causing severe rash involving mucus membranes or skin necrosis: Unknown Has patient had a PCN reaction that required hospitalization No Has patient had a PCN  reaction occurring within the last 10 years: No If all of the above answers are "NO", then may proceed with Cephalosporin use.      Medication List       Accurate as of October 20, 2020 11:17 AM. If you have any questions, ask your nurse or doctor.        amLODipine 5 MG tablet Commonly known as: NORVASC 5 mg.   esomeprazole 20 MG capsule Commonly known as: NEXIUM Take 20 mg by mouth daily.   fexofenadine 180 MG tablet Commonly known as: ALLEGRA Take 180 mg by mouth daily as needed for allergies.   Fish Oil 1000 MG Caps Take by mouth.   HYDROCHLOROTHIAZIDE PO Take 25 mg by mouth daily.   losartan 100 MG tablet Commonly known as: COZAAR Take 100 mg by mouth daily.   mirtazapine 15 MG tablet Commonly known as: REMERON Take 0.5-1 tablets (7.5-15 mg total) by mouth at bedtime.   Oxcarbazepine 300 MG tablet Commonly known as: TRILEPTAL Take 1 tablet (300 mg) by mouth in the morning and 2 tablets (600 mg) at bedtime.   sertraline 100 MG tablet Commonly known as: ZOLOFT Take 2 tablets (200 mg total) by mouth daily.   tamsulosin 0.4 MG Caps capsule Commonly known as: FLOMAX Take 1 capsule (0.4 mg total) by mouth 2 (two) times daily.   vitamin C 100 MG tablet Take 100 mg by mouth daily.   Vitamin D3 25 MCG (1000 UT) Caps Take by mouth.       Allergies:  Allergies  Allergen Reactions  . Shellfish Allergy Anaphylaxis  . Penicillins     Unknown, childhood reaction Has patient had a PCN reaction causing immediate rash, facial/tongue/throat swelling, SOB or lightheadedness with hypotension: Unknown Has patient had a PCN reaction causing severe rash involving mucus membranes or skin necrosis: Unknown Has patient had a PCN reaction that required hospitalization No Has patient had a PCN reaction occurring within the last 10 years: No If all of the above answers are "NO", then may proceed with Cephalosporin use.     Family History  Problem Relation Age of Onset   . Heart disease Mother   . Diabetes Mother   . Cancer Father        prostate  . Heart disease Unknown   . Cancer Unknown     Social History:  reports that he has never smoked. He has never used smokeless tobacco. He reports that he does not drink alcohol and does not use drugs.  ROS: A complete review of systems was performed.  All systems are negative except for pertinent findings as noted.  Physical Exam:  Vital signs in last 24 hours: BP (!) 158/88   Pulse 70   Temp 98 F (36.7 C)   Ht 5' 8"  (1.727 m)   Wt (!) 305 lb (138.3 kg)   BMI  46.38 kg/m  Constitutional:  Alert and oriented, No acute distress Cardiovascular: Regular rate  Respiratory: Normal respiratory effort GI: Abdomen is soft, nontender, nondistended, no abdominal masses. No CVAT. No hernias. Genitourinary: Normal male phallus, testes are descended bilaterally and non-tender and without masses, scrotum is normal in appearance without lesions or masses, perineum is normal on inspection. Prostate feels about 30 grams. Lymphatic: No lymphadenopathy Neurologic: Grossly intact, no focal deficits Psychiatric: Normal mood and affect   I have reviewed prior pt notes  I have reviewed notes from referring/previous physicians  I have reviewed urinalysis results  I have independently reviewed prior imaging   Impression/Assessment:  BPH w/ LUTS - DRE shows prostate is about 30 grams and otherwise unremarkable.  Plan:  1. Pt refilled on tamsulosin but advised to taper his regimen, attempt vacation, and discontinue as is tolerable.  2. Will request PSA records from pt PCP.  3. Pt encouraged to improve his cardiovascular fitness and practice a heart-healthy diet.  CC: Launa Grill, PA

## 2020-10-20 NOTE — Telephone Encounter (Signed)
Wife called and made aware and lab appointment made.

## 2020-10-21 ENCOUNTER — Ambulatory Visit (INDEPENDENT_AMBULATORY_CARE_PROVIDER_SITE_OTHER): Payer: BC Managed Care – PPO | Admitting: Psychiatry

## 2020-10-21 ENCOUNTER — Encounter: Payer: Self-pay | Admitting: Psychiatry

## 2020-10-21 DIAGNOSIS — F4323 Adjustment disorder with mixed anxiety and depressed mood: Secondary | ICD-10-CM

## 2020-10-21 NOTE — Progress Notes (Signed)
      [facility] Counselor/Therapist Progress Note  Patient ID: Jose [name], MRN: [id number],    Date: 10/21/2020  Time Spent: 50 minutes   Treatment Type: Individual Therapy  Reported Symptoms: anxiety, irritable  Mental Status Exam:  Appearance:   Casual     Behavior:  Appropriate  Motor:  Normal  Speech/Language:   Clear and Coherent  Affect:  Appropriate  Mood:  anxious and irritable  Thought process:  normal  Thought content:    WNL  Sensory/Perceptual disturbances:    WNL  Orientation:  oriented to person, place, time/date and situation  Attention:  Good  Concentration:  Good  Memory:  WNL  Fund of knowledge:   Good  Insight:    Good  Judgment:   Good  Impulse Control:  Good   Risk Assessment: Danger to Self:  No Self-injurious Behavior: No Danger to Others: No Duty to Warn:no Physical Aggression / Violence:No  Access to Firearms a concern: No  Gang Involvement:No   Subjective: The client and his wife have discussed that his biggest issue is stress which leads to irritability and anxiety.  The client readily admits that when things do not go according to plan it raises his anxiety.  Uncertainty or a sense of powerlessness is difficult for him.  The place that he finds most peace is when he is accomplishing things.  "I have to accomplish."  He rates at how good his day was on what he accomplished.  We discussed the difference between intrinsic value versus performance value.  I explained to the client that if he bases his value upon performance that is an unsteady foundation that could collapse at any point.  Intrinsic value has a more spiritual orientation and that we find our value placed in [place] by God.  The client agrees but is unsure how to make that transition.  He has realized that when he is going to places he is less worried about how he looks versus how he was in the past.  Today I used the bilateral stimulation hand paddles with the client as he talked  about these issues.  "This really calms down my head."  The client felt his subjective units of distress go from a 7 to less than 3 at the end of the session.  We will continue to work on the issues around intrinsic value versus performance.  Interventions: Motivational Interviewing, Solution-Oriented/Positive Psychology, [facility] Desensitization and Reprocessing (EMDR) and Insight-Oriented  Diagnosis:   ICD-10-CM   1. Adjustment disorder with mixed anxiety and depressed mood  F43.23     Plan: Mood independent behavior, positive self talk, self-care, exercise, engaged activities.  Clavin Ruhlman, [facility]

## 2020-10-22 ENCOUNTER — Other Ambulatory Visit: Payer: Self-pay

## 2020-10-22 ENCOUNTER — Other Ambulatory Visit: Payer: BC Managed Care – PPO

## 2020-10-22 DIAGNOSIS — N400 Enlarged prostate without lower urinary tract symptoms: Secondary | ICD-10-CM

## 2020-10-23 LAB — PSA: Prostate Specific Ag, Serum: 0.5 ng/mL (ref 0.0–4.0)

## 2020-10-27 NOTE — Progress Notes (Signed)
Sent via mychart

## 2020-11-11 ENCOUNTER — Ambulatory Visit: Payer: BC Managed Care – PPO | Admitting: Psychiatry

## 2020-12-02 ENCOUNTER — Ambulatory Visit (INDEPENDENT_AMBULATORY_CARE_PROVIDER_SITE_OTHER): Payer: BC Managed Care – PPO | Admitting: Psychiatry

## 2020-12-02 ENCOUNTER — Encounter: Payer: Self-pay | Admitting: Psychiatry

## 2020-12-02 ENCOUNTER — Other Ambulatory Visit: Payer: Self-pay

## 2020-12-02 DIAGNOSIS — F4323 Adjustment disorder with mixed anxiety and depressed mood: Secondary | ICD-10-CM

## 2020-12-02 NOTE — Progress Notes (Signed)
      [facility] Counselor/Therapist Progress Note  Patient ID: GUERINO CAPORALE, MRN: [id number],    Date: 12/02/2020  Time Spent: 50 minutes   Treatment Type: Individual Therapy  Reported Symptoms: irritability, anxiety  Mental Status Exam:  Appearance:   Casual     Behavior:  Appropriate  Motor:  Normal  Speech/Language:   Clear and Coherent  Affect:  Appropriate  Mood:  anxious and irritable  Thought process:  normal  Thought content:    WNL  Sensory/Perceptual disturbances:    WNL  Orientation:  oriented to person, place, time/date and situation  Attention:  Good  Concentration:  Good  Memory:  WNL  Fund of knowledge:   Good  Insight:    Good  Judgment:   Good  Impulse Control:  Good   Risk Assessment: Danger to Self:  No Self-injurious Behavior: No Danger to Others: No Duty to Warn:no Physical Aggression / Violence:No  Access to Firearms a concern: No  Gang Involvement:No   Subjective: The client states since last session that he had contracted COVID-19.  "I am fully recovered but I lost my sense of taste and smell for a week."  Today he comes in with his wife to talk about how if his agenda gets disrupted he can become explosively angry.  His wife, Levada Dy, states that he is much better than he used to be.  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  She discussed an issue that happened this morning with the client.  They are having their new house tiled.  The tile man needed some wood as a spacer.  The client's wife approached him while he was mowing to request the wood.  The client stated he was trying to get as much mowing done as he could before this appointment.  Also it is going to rain tomorrow so he wants to get it done.  He admits that he became very agitated and upset.  The client's wife stated he did calm down more quickly than he has in the past.  The client feels that the medicine has helped him although recently he has  been having problems with dizziness and balance.  He attributes that to the psychiatric medication.  He will discuss that with [name]. The client's 67 year old dog also had to be put down last week.  "That dog was everything to me."  Client states that he does not have children but that dog was like a son to him.  He readily admits that he has been grieving the loss of the dog which Carsynn Bethune contribute to his irritability.  I discussed with the client using EMDR at next session to help reduce his irritability and increase his frustration tolerance to circumstances.  I suggested the client look at the supplement l-theanine to use as a way to reduce his irritability during the day.  The client stated he would try that.  Interventions: Assertiveness/Communication, Motivational Interviewing, Solution-Oriented/Positive Psychology and Insight-Oriented  Diagnosis:   ICD-10-CM   1. Adjustment disorder with mixed anxiety and depressed mood  F43.23     Plan: Mood independent behavior, positive self talk, self-care, assertiveness, boundaries, radical acceptance, supplements.  Zaila Crew, [facility]

## 2020-12-23 ENCOUNTER — Other Ambulatory Visit: Payer: Self-pay

## 2020-12-23 ENCOUNTER — Encounter: Payer: Self-pay | Admitting: Psychiatry

## 2020-12-23 ENCOUNTER — Ambulatory Visit (INDEPENDENT_AMBULATORY_CARE_PROVIDER_SITE_OTHER): Payer: BC Managed Care – PPO | Admitting: Psychiatry

## 2020-12-23 ENCOUNTER — Other Ambulatory Visit: Payer: Self-pay | Admitting: Psychiatry

## 2020-12-23 DIAGNOSIS — F39 Unspecified mood [affective] disorder: Secondary | ICD-10-CM | POA: Diagnosis not present

## 2020-12-23 DIAGNOSIS — F411 Generalized anxiety disorder: Secondary | ICD-10-CM

## 2020-12-23 DIAGNOSIS — F4323 Adjustment disorder with mixed anxiety and depressed mood: Secondary | ICD-10-CM | POA: Diagnosis not present

## 2020-12-23 DIAGNOSIS — F4310 Post-traumatic stress disorder, unspecified: Secondary | ICD-10-CM | POA: Diagnosis not present

## 2020-12-23 DIAGNOSIS — F5105 Insomnia due to other mental disorder: Secondary | ICD-10-CM | POA: Diagnosis not present

## 2020-12-23 MED ORDER — SERTRALINE HCL 100 MG PO TABS: 200.0000 mg | ORAL_TABLET | Freq: Every day | ORAL | 1 refills | Status: DC

## 2020-12-23 MED ORDER — OXCARBAZEPINE 300 MG PO TABS
ORAL_TABLET | ORAL | 1 refills | Status: DC
Start: 2020-12-23 — End: 2021-03-23

## 2020-12-23 NOTE — Progress Notes (Signed)
ADLAI SINNING [id number] 1971/05/21 50 y.o.  Subjective:   Patient ID:  Jose [name] is a 50 y.o. (DOB Sep 25, 1970) male.  Chief Complaint:  Chief Complaint  Patient presents with  . Follow-up  . Episodic mood disorder ([place])  . Anxiety    HPI Jose [name] presents to the office today for follow-up of anger, irritability and anxiety.  At visit in April 23.  Started sertraline 50 mg and reduced lamotrigine to 100 mg daily.  Still irritable but better control of the anger.    When seen in June 2020.  For continued symptoms oxcarbazepine was increased to 900 mg daily.  He was weaned off lamotrigine because it was not felt to be very helpful.  He continued mirtazapine for sleep.  He continued low-dose sertraline for anxiety because Initially sertraline at HS caused insomnia and wired him. Up.   Sertraline  Change to 100 mg on March 06, 2019.   seen January 2021.Marland Kitchen  No meds were changed.   He called December 12, 2019 wanting to increase medication.  At that time his wife told staff that he was more agitated and angry.  He had discussed this with his therapist [name] and they agreed a med change might be helpful.  He was instructed to increase Trileptal with an additional 300 mg daily.  Also to move his appointment up.  As of appointment December 20, 2019 the following is noted:  Seen with wife, Jose [name]. A lot going on and a lot of stress and demands.  Admits chronic anger problems in work environment.  Can't get away with anger outbursts anymore.  Hard life the way he and sister raised.  W said he's perfectionistic and a workaholic.   Increased Trileptal to 900 mg from 600 mg daily and it's slowed him down and reduced the racing thoughts.  No SE to it. She says anger was worse with added stress at work.  Gets foul language and obsesses on minor things and loses his temper.  With the increase it helped significantly by taking more time to think things through and less agitated. Sleep is great   Leave it like it is.  Doing pretty good. Mirtazapine works for sleep.  No med changes since here. Has cont counseling.  Wife said leave everything alone and doing well.  Still problems with low sex drive.  Still doesn't really want  Him to change but ambivalent.  Can still get anger but handles it better and lets it go easier.    Can let go of anger quicker.  Still doing well with it unless provoked at work.  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   Wife noticed I don't hold onto it.   still get mad but handling it better.  Been angry all his life.  Grew up in a bad situation with bipolar, schizophrenic with hospitalizations as a kid.  Has a farm with equipment.  Anxiety is high bc involved in a project.  Still jump on people including father and sister yesterday.  I've done well for myself and I tolerate very little.  But is generous.   Probably way worse than he really lets on.  I got issues. sex drive now better with sertraline. Plan no med changes  06/23/2020 appointment with following noted: Good overall but bad days occ.  Less than 1 bad day weekly.   Is having some anxiety but feels like it's due to being so  busy with so many things going on.  Sunday was a bad day. Nervous about Covid shot and hasn't gotten it.  Wife has gotten the first.   Still can be easily agitated.  Doesn't go anywhere and little social interaction.   2 mos from selling the dairy.  He's been kind of out of the business for 3 years bc of his temper. Still have active sex life and handles sertraline better than initially. Plan: Increase sertraline 150 mg daily to further reduce the anxiety.    09/24/20 appt noted:  Ill today and mad at the world.  Hasn't had a bad day in awhile.  But today could fight the world.  Has to do with owning a business.  Been like that my whole life.   I don't think straight when I'm mad.  Overall it is better than it was before the increase in sertraline.  SE  early sex SE resolved.     Would like to try a higher dose to see if it could be better. Xmas has never good and this was best Xmas ever. Sleep still good.  Plan: Increase sertraline 200 mg daily to further reduce the anxiety.  12/23/20 appt noted: No change noted.  But work load increased.  Just finished building his own house.  Just moved into the house.That was a lot of stress so maybe difficult to evaluate effect of higher dose sertraline.   No SE noted.  No sex SE.  Patient reports stable mood and denies depressed but is irritable moods.  He has struggled with anger all his life and does not believe that we will ever fully resolved.  Patient denies difficulty with sleep initiation or maintenance. Sleep like a baby.  Denies appetite disturbance.  Patient reports that energy and motivation have been good.  Patient denies any difficulty with concentration.  Patient denies any suicidal ideation.  On [place] obsessive-compulsive inventory he answered yes to obsessive thoughts 1 through 5, no to 6 and 7 yes to 7 and 8, noted #10 yes to 1112 and 13, no 214 1516, yes to 17, no to 1819, yesterday #20.  On part B he answered to his extreme to questions 124 and 5 and little control of #3.  No history of D&A abuse. Always liked to eat.  Past Psychiatric Medication Trials: Depakote, lamotrigine, Equetro, Trileptal 900tal ,  Latuda 20, Sertraline 150 ,   fluvoxamine  Review of Systems:  Review of Systems  Cardiovascular: Negative for palpitations.  Neurological: Negative for tremors, weakness and headaches.  Psychiatric/Behavioral: Positive for behavioral problems. The patient is nervous/anxious.     Medications: I have reviewed the patient's current medications.  Current Outpatient Medications  Medication Sig Dispense Refill  . amLODipine (NORVASC) 5 MG tablet 5 mg.    . Ascorbic Acid (VITAMIN C) 100 MG tablet Take 100 mg by mouth daily.    . Cholecalciferol (VITAMIN D3) 25 MCG (1000 UT) CAPS  Take by mouth.    . esomeprazole (NEXIUM) 20 MG capsule Take 20 mg by mouth daily.     . fexofenadine (ALLEGRA) 180 MG tablet Take 180 mg by mouth daily as needed for allergies.     Marland Kitchen HYDROCHLOROTHIAZIDE PO Take 25 mg by mouth daily.     Marland Kitchen losartan (COZAAR) 100 MG tablet Take 100 mg by mouth daily.     . mirtazapine (REMERON) 15 MG tablet Take 0.5-1 tablets (7.5-15 mg total) by mouth at bedtime. 90 tablet 0  . Omega-3 Fatty  Acids (FISH OIL) 1000 MG CAPS Take by mouth.    . Oxcarbazepine (TRILEPTAL) 300 MG tablet Take 1 tablet (300 mg) by mouth in the morning and 2 tablets (600 mg) at bedtime. 270 tablet 1  . sertraline (ZOLOFT) 100 MG tablet Take 2 tablets (200 mg total) by mouth daily. 180 tablet 1  . tamsulosin (FLOMAX) 0.4 MG CAPS capsule Take 1 capsule (0.4 mg total) by mouth 2 (two) times daily. 180 capsule 3   No current facility-administered medications for this visit.    Medication Side Effects:  Marked sexual SE.  Looser BM   Allergies:  Allergies  Allergen Reactions  . Shellfish Allergy Anaphylaxis  . Penicillins     Unknown, childhood reaction Has patient had a PCN reaction causing immediate rash, facial/tongue/throat swelling, SOB or lightheadedness with hypotension: Unknown Has patient had a PCN reaction causing severe rash involving mucus membranes or skin necrosis: Unknown Has patient had a PCN reaction that required hospitalization No Has patient had a PCN reaction occurring within the last 10 years: No If all of the above answers are "NO", then may proceed with Cephalosporin use.     Past Medical History:  Diagnosis Date  . Diverticulitis   . GERD (gastroesophageal reflux disease)   . Hypertension   . Seasonal allergies     Family History  Problem Relation Age of Onset  . Heart disease Mother   . Diabetes Mother   . Cancer Father        prostate  . Heart disease Unknown   . Cancer Unknown     Social History   Socioeconomic History  . Marital status:  Married    Spouse name: Jose [name]  . Number of children: 2  . Years of education: 28  . Highest education level: Not on file  Occupational History    Employer: [employer]    Comment: [employer]  Tobacco Use  . Smoking status: Never Smoker  . Smokeless tobacco: Never Used  Substance and Sexual Activity  . Alcohol use: No  . Drug use: No  . Sexual activity: Not on file  Other Topics Concern  . Not on file  Social History Narrative  . Not on file   Social Determinants of Health   Financial Resource Strain: Not on file  Food Insecurity: Not on file  Transportation Needs: Not on file  Physical Activity: Not on file  Stress: Not on file  Social Connections: Not on file  Intimate Partner Violence: Not on file    Past Medical History, Surgical history, Social history, and Family history were reviewed and updated as appropriate.   Please see review of systems for further details on the patient's review from today.   Objective:   Physical Exam:  There were no vitals taken for this visit.  Physical Exam Constitutional:      General: He is not in acute distress.    Appearance: He is well-developed. He is obese.  Musculoskeletal:        General: No deformity.  Neurological:     Mental Status: He is alert and oriented to person, place, and time.     Coordination: Coordination normal.  Psychiatric:        Attention and Perception: Attention normal. He is attentive.        Mood and Affect: Mood is anxious. Mood is not depressed. Affect is not labile, blunt, angry or inappropriate.        Speech: Speech normal. Speech is not  rapid and pressured.        Behavior: Behavior normal. Behavior is not agitated or aggressive.        Thought Content: Thought content normal. Thought content does not include homicidal or suicidal ideation. Thought content does not include homicidal or suicidal plan.        Cognition and Memory: Cognition normal.        Judgment: Judgment normal.     Comments:  Insight is fair.  He still has irritability but was certainly appropriate in the office.       Lab Review:     Component Value Date/Time   NA 144 03/12/2012 1100   K 3.6 03/12/2012 1100   CL 104 03/12/2012 1100   CO2 29 03/12/2012 1100   GLUCOSE 108 (H) 03/12/2012 1100   BUN 15 03/12/2012 1100   CREATININE 0.83 03/12/2012 1100   CALCIUM 10.5 03/12/2012 1100   PROT 6.4 10/13/2010 2037   ALBUMIN 4.7 10/13/2010 2037   AST 16 10/13/2010 2037   ALT 19 10/13/2010 2037   ALKPHOS 57 10/13/2010 2037   BILITOT 1.4 (H) 10/13/2010 2037   GFRNONAA >90 03/12/2012 1100   GFRAA >90 03/12/2012 1100       Component Value Date/Time   WBC 10.0 09/27/2010 1417   RBC 5.24 09/27/2010 1417   HGB 16.4 03/12/2012 1100   HCT 45.6 03/12/2012 1100   PLT 218 09/27/2010 1417   MCV 84.2 09/27/2010 1417   MCH 30.2 09/27/2010 1417   MCHC 35.8 09/27/2010 1417   RDW 12.9 09/27/2010 1417   LYMPHSABS 2.9 09/27/2010 1417   MONOABS 0.5 09/27/2010 1417   EOSABS 0.3 09/27/2010 1417   BASOSABS 0.1 09/27/2010 1417    No results found for: POCLITH, LITHIUM   No results found for: PHENYTOIN, PHENOBARB, VALPROATE, CBMZ   .res Assessment: Plan:    Jose [name] was seen today for follow-up, episodic mood disorder ([facility]) and anxiety.  Diagnoses and all orders for this visit:  Episodic mood disorder ([place])  PTSD (post-traumatic stress disorder)  Generalized anxiety disorder  Insomnia due to mental condition     History of bipolar disorder versus major depression Rule out intermittent explosive disorder Rule out OCD variant but it seems to just focus on perfectionism.  Greater than 50% of face to face time with patient was spent on counseling and coordination of care. We discussed Patient describes history of perfectionism and emotional intensity that sometimes leads to outbursts of anger.  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  Has lost friendships over anger outbursts.  Has worked on  it in therapy. Unclear about a  distinct pattern of cyclic mania but the meds that have been used are meds that are often used to treat anger outbursts effectively.   However he and his wife feel that his anxiety which sometimes leads to anger is no longer adequately managed.   It has caused problems and family relationships recently.  Continue oxcarbazepine 300 mg AM and 600 mg PM  continue sertraline 200 mg daily to further reduce the anxiety.  He clearly saw additional benefit with the increase in sertraline and that he is less ruminative on his anger. We need to give it more time.  He is open to the possibility of increasing this as an alternative to increasing the oxcarbazepine if is not effective enough.  Sexual side effects have resolved sertraline does more for him than any other med he's taking. So relieved to be in  control of his anger and doesn't want to change it.    Recent PE unremarkable.  The mirtazapine is effective for his sleep at one half of a 15 mg tablet so we will not change that.  Will not transfer counseling bc F May retiring.  Follow-up 3-mos  Lynder Parents, MD, DFAPA    Future Appointments  Date Time Provider [place]  12/23/2020 10:00 AM May, [place], [facility] None  01/19/2021 10:00 AM May, [place], [facility] CP[facility]CP None  10/19/2021  9:15 AM Franchot Gallo, MD [facility] None    No orders of the defined types were placed in this encounter.   -------------------------------

## 2020-12-23 NOTE — Progress Notes (Signed)
      [facility] Counselor/Therapist Progress Note  Patient ID: Jose [name], MRN: [id number],    Date: 12/23/2020  Time Spent: 56 Minutes   Treatment Type: Individual Therapy  Reported Symptoms: sad, anxious  Mental Status Exam:  Appearance:   Casual and Well Groomed     Behavior:  Appropriate  Motor:  Normal  Speech/Language:   Clear and Coherent  Affect:  Appropriate  Mood:  anxious and sad  Thought process:  normal  Thought content:    WNL  Sensory/Perceptual disturbances:    WNL  Orientation:  oriented to person, place, time/date and situation  Attention:  Good  Concentration:  Good  Memory:  WNL  Fund of knowledge:   Good  Insight:    Good  Judgment:   Good  Impulse Control:  Good   Risk Assessment: Danger to Self:  No Self-injurious Behavior: No Danger to Others: No Duty to Warn:no Physical Aggression / Violence:No  Access to Firearms a concern: No  Gang Involvement:No   Subjective: The client states that he just moved into his new house.  He realizes that he needs to have another project to work on to keep his mind busy.  His stepson will be moving into his old home and vacating the rental home in [place], [place].  The client will focus on remodeling that house either for sale or to continue as a rental. The client states he is very sad about this counselor retiring.  He stated after last session he was in a funk for 3 days.  I will discuss with [name] who he should possibly follow up with.  I told the client that I would contact him with that information.  Today I used the bilateral stimulation hand paddles with the client focusing on his sadness.  He visualized himself in his workshop with his grandson.  Jesus entered into the picture and he told Jesus that he was sad and what he had missed out on.  He felt very calm and peaceful that Jesus told him he has done well in spite of what he is missed.  He noticed that his sadness dropped from a subjective  units of distress of 8+ to less than 2.  He was much more cheerful at the end of the session.  I again reassured him that I would contact him with his follow-up therapist.  Interventions: Motivational Interviewing, Solution-Oriented/Positive Psychology, [facility] Desensitization and Reprocessing (EMDR) and Insight-Oriented  Diagnosis:   ICD-10-CM   1. Adjustment disorder with mixed anxiety and depressed mood  F43.23     Plan: Positive self talk, self-care, engaged activities, social network, radical acceptance.  Read [place], [facility]

## 2021-01-19 ENCOUNTER — Ambulatory Visit: Payer: BC Managed Care – PPO | Admitting: Psychiatry

## 2021-03-11 ENCOUNTER — Encounter: Payer: Self-pay | Admitting: Psychiatry

## 2021-03-11 ENCOUNTER — Ambulatory Visit (INDEPENDENT_AMBULATORY_CARE_PROVIDER_SITE_OTHER): Payer: BC Managed Care – PPO | Admitting: Psychiatry

## 2021-03-11 ENCOUNTER — Other Ambulatory Visit: Payer: Self-pay

## 2021-03-11 DIAGNOSIS — F39 Unspecified mood [affective] disorder: Secondary | ICD-10-CM | POA: Diagnosis not present

## 2021-03-11 DIAGNOSIS — F411 Generalized anxiety disorder: Secondary | ICD-10-CM | POA: Diagnosis not present

## 2021-03-11 DIAGNOSIS — F5105 Insomnia due to other mental disorder: Secondary | ICD-10-CM

## 2021-03-11 DIAGNOSIS — F4310 Post-traumatic stress disorder, unspecified: Secondary | ICD-10-CM

## 2021-03-11 MED ORDER — SERTRALINE HCL 100 MG PO TABS: 300.0000 mg | ORAL_TABLET | Freq: Every day | ORAL | 0 refills | Status: DC

## 2021-03-11 MED ORDER — MIRTAZAPINE 15 MG PO TABS: 7.5000 mg | ORAL_TABLET | Freq: Every day | ORAL | 0 refills | Status: DC
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
## 2021-03-22 ENCOUNTER — Other Ambulatory Visit: Payer: Self-pay | Admitting: Psychiatry

## 2021-03-22 DIAGNOSIS — F39 Unspecified mood [affective] disorder: Secondary | ICD-10-CM

## 2021-03-31 ENCOUNTER — Telehealth: Payer: Self-pay

## 2021-03-31 NOTE — Telephone Encounter (Signed)
Prior Authorization submitted and Approved for SERTRALINE 100 MG #270 for 90 day effective 03/29/2021-03/28/2022 with [employer] ID# [id number]

## 2021-04-09 ENCOUNTER — Other Ambulatory Visit: Payer: Self-pay

## 2021-04-09 ENCOUNTER — Encounter: Payer: Self-pay | Admitting: Psychiatry

## 2021-04-09 ENCOUNTER — Ambulatory Visit (INDEPENDENT_AMBULATORY_CARE_PROVIDER_SITE_OTHER): Payer: BC Managed Care – PPO | Admitting: Psychiatry

## 2021-04-09 DIAGNOSIS — R454 Irritability and anger: Secondary | ICD-10-CM

## 2021-04-09 DIAGNOSIS — F5105 Insomnia due to other mental disorder: Secondary | ICD-10-CM

## 2021-04-09 DIAGNOSIS — F411 Generalized anxiety disorder: Secondary | ICD-10-CM

## 2021-04-09 DIAGNOSIS — F39 Unspecified mood [affective] disorder: Secondary | ICD-10-CM | POA: Diagnosis not present

## 2021-04-09 DIAGNOSIS — F4310 Post-traumatic stress disorder, unspecified: Secondary | ICD-10-CM | POA: Diagnosis not present
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
## 2021-06-01 ENCOUNTER — Ambulatory Visit: Payer: BC Managed Care – PPO | Admitting: Psychiatry

## 2021-06-28 ENCOUNTER — Other Ambulatory Visit: Payer: Self-pay | Admitting: Psychiatry

## 2021-06-28 DIAGNOSIS — F411 Generalized anxiety disorder: Secondary | ICD-10-CM

## 2021-06-28 DIAGNOSIS — F4310 Post-traumatic stress disorder, unspecified: Secondary | ICD-10-CM

## 2021-06-28 DIAGNOSIS — F39 Unspecified mood [affective] disorder: Secondary | ICD-10-CM

## 2021-06-29 ENCOUNTER — Ambulatory Visit: Payer: BC Managed Care – PPO | Admitting: Psychiatry

## 2021-07-14 ENCOUNTER — Encounter: Payer: Self-pay | Admitting: Psychiatry

## 2021-07-14 ENCOUNTER — Other Ambulatory Visit: Payer: Self-pay

## 2021-07-14 ENCOUNTER — Ambulatory Visit (INDEPENDENT_AMBULATORY_CARE_PROVIDER_SITE_OTHER): Payer: BC Managed Care – PPO | Admitting: Psychiatry

## 2021-07-14 DIAGNOSIS — F411 Generalized anxiety disorder: Secondary | ICD-10-CM | POA: Diagnosis not present

## 2021-07-14 DIAGNOSIS — F39 Unspecified mood [affective] disorder: Secondary | ICD-10-CM

## 2021-07-14 DIAGNOSIS — R454 Irritability and anger: Secondary | ICD-10-CM

## 2021-07-14 DIAGNOSIS — F4310 Post-traumatic stress disorder, unspecified: Secondary | ICD-10-CM | POA: Diagnosis not present

## 2021-07-14 DIAGNOSIS — F5105 Insomnia due to other mental disorder: Secondary | ICD-10-CM

## 2021-07-14 MED ORDER — MIRTAZAPINE 15 MG PO TABS: 7.5000 mg | ORAL_TABLET | Freq: Every day | ORAL | 0 refills | Status: DC

## 2021-07-14 MED ORDER — OXCARBAZEPINE 300 MG PO TABS: ORAL_TABLET | ORAL | 1 refills | Status: DC

## 2021-07-14 MED ORDER — SERTRALINE HCL 100 MG PO TABS: ORAL_TABLET | ORAL | 1 refills | Status: DC
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
## 2021-10-16 NOTE — Progress Notes (Signed)
History of Present Illness:   10.22.2019: referred by Dr. Rory Percy for evaluation and management of lower urinary tract issues. He does have obstructive symptomatology, with some improvement on tamsulosin which was started a few weeks ago. However, his symptoms have gotten a bit worse recently. There is a family history of prostate cancer. PSA recently was 0.6. He was started on bid dosing of Flomax.    12.3.2019: He returns for follow up. IPSS 10, quality of life 1. No significant side effects from the increased dose of tamsulosin.    2.9.2021: Here today for follow-up reporting significantly improved urinary sx's on 2x tamsulosin daily. He denies any significant side effects from tamsulosin though does note intermittent retrograde ejaculation (1/10 times), though he is also on zoloft and thinks its side effects may be exacerbating this.    2.15.2022: Jose [name] is here today for his regularly scheduled f/u of his BPH. He continues on tamsulosin (qd) and denies any significant change or worsening of LUTS. He denies any ED at this time.  2.14.2023: He has been OK taking tamsulosin QOD. IPSS 2  QoL score 1. PSA last year 0.5.  Past Medical History:  Diagnosis Date   Diverticulitis    GERD (gastroesophageal reflux disease)    Hypertension    Seasonal allergies     Past Surgical History:  Procedure Laterality Date   CHOLECYSTECTOMY  04/2009   CHOLECYSTECTOMY     COLONOSCOPY     by Dr. Kem Boroughs polyps   ESOPHAGOGASTRODUODENOSCOPY  05/2009   Schatzki's ring, s/p dilatation + eosinophilic   KNEE ARTHROSCOPY  03/16/2012   Procedure: ARTHROSCOPY KNEE;  Surgeon: Carole Civil, MD;  Location: [facility];  Service: Orthopedics;  Laterality: Left;  drilling and abrasion, arthroplasty of trochlea   KNEE SURGERY     left   UMBILICAL HERNIA REPAIR      Home Medications:  Allergies as of 10/19/2021       Reactions   Shellfish Allergy Anaphylaxis   Penicillins    Unknown, childhood  reaction Has patient had a PCN reaction causing immediate rash, facial/tongue/throat swelling, SOB or lightheadedness with hypotension: Unknown Has patient had a PCN reaction causing severe rash involving mucus membranes or skin necrosis: Unknown Has patient had a PCN reaction that required hospitalization No Has patient had a PCN reaction occurring within the last 10 years: No If all of the above answers are "NO", then may proceed with Cephalosporin use.        Medication List        Accurate as of October 16, 2021  8:12 PM. If you have any questions, ask your nurse or doctor.          amLODipine 5 MG tablet Commonly known as: NORVASC 5 mg.   esomeprazole 20 MG capsule Commonly known as: NEXIUM Take 20 mg by mouth daily.   fexofenadine 180 MG tablet Commonly known as: ALLEGRA Take 180 mg by mouth daily as needed for allergies.   Fish Oil 1000 MG Caps Take by mouth.   HYDROCHLOROTHIAZIDE PO Take 25 mg by mouth daily.   losartan 100 MG tablet Commonly known as: COZAAR Take 100 mg by mouth daily.   mirtazapine 15 MG tablet Commonly known as: REMERON Take 0.5-1 tablets (7.5-15 mg total) by mouth at bedtime.   Oxcarbazepine 300 MG tablet Commonly known as: TRILEPTAL TAKE ONE TABLET BY MOUTH IN THE MORNING AND TAKE TWO (2) TABLETS AT BEDTIME   sertraline 100 MG tablet Commonly known as:  ZOLOFT TAKE THREE (3) TABLETS BY MOUTH DAILY   tamsulosin 0.4 MG Caps capsule Commonly known as: FLOMAX Take 1 capsule (0.4 mg total) by mouth 2 (two) times daily.   vitamin C 100 MG tablet Take 100 mg by mouth daily.   Vitamin D3 25 MCG (1000 UT) Caps Take by mouth.        Allergies:  Allergies  Allergen Reactions   Shellfish Allergy Anaphylaxis   Penicillins     Unknown, childhood reaction Has patient had a PCN reaction causing immediate rash, facial/tongue/throat swelling, SOB or lightheadedness with hypotension: Unknown Has patient had a PCN reaction causing  severe rash involving mucus membranes or skin necrosis: Unknown Has patient had a PCN reaction that required hospitalization No Has patient had a PCN reaction occurring within the last 10 years: No If all of the above answers are "NO", then may proceed with Cephalosporin use.     Family History  Problem Relation Age of Onset   Heart disease Mother    Diabetes Mother    Cancer Father        prostate   Heart disease Unknown    Cancer Unknown     Social History:  reports that he has never smoked. He has never used smokeless tobacco. He reports that he does not drink alcohol and does not use drugs.  ROS: A complete review of systems was performed.  All systems are negative except for pertinent findings as noted.  Physical Exam:  Vital signs in last 24 hours: There were no vitals taken for this visit. Constitutional:  Alert and oriented, No acute distress Cardiovascular: Regular rate  Respiratory: Normal respiratory effort GI: No hernias Genitourinary: Normal male phallus, testes are descended bilaterally and non-tender and without masses, scrotum is normal in appearance without lesions or masses, perineum is normal on inspection.  Rectal 30 g gland, symmetric, nonnodular nontender Lymphatic: No lymphadenopathy Neurologic: Grossly intact, no focal deficits Psychiatric: Normal mood and affect  I have reviewed prior pt notes  I have reviewed urinalysis results  I have reviewed prior PSA results    Impression/Assessment:  BPH with mild symptomatology on every other day tamsulosin  Screening for prostate cancer in men with family history of prostate cancer, normal exam today  Plan:  1.  I let him know that he might try taking a vacation from the tamsulosin saying that he is voiding so well  2.  His PSA is checked today  3.  Office visit in 1 year for recheck

## 2021-10-19 ENCOUNTER — Encounter: Payer: Self-pay | Admitting: Urology

## 2021-10-19 ENCOUNTER — Other Ambulatory Visit: Payer: Self-pay

## 2021-10-19 ENCOUNTER — Ambulatory Visit (INDEPENDENT_AMBULATORY_CARE_PROVIDER_SITE_OTHER): Payer: BC Managed Care – PPO | Admitting: Urology

## 2021-10-19 VITALS — BP 159/73 | HR 90 | Wt 324.0 lb

## 2021-10-19 DIAGNOSIS — N400 Enlarged prostate without lower urinary tract symptoms: Secondary | ICD-10-CM

## 2021-10-19 LAB — URINALYSIS, ROUTINE W REFLEX MICROSCOPIC
Bilirubin, UA: NEGATIVE
Ketones, UA: NEGATIVE
Leukocytes,UA: NEGATIVE
Nitrite, UA: NEGATIVE
Protein,UA: NEGATIVE
RBC, UA: NEGATIVE
Specific Gravity, UA: 1.025 (ref 1.005–1.030)
Urobilinogen, Ur: 0.2 mg/dL (ref 0.2–1.0)
pH, UA: 6.5 (ref 5.0–7.5)

## 2021-10-20 LAB — PSA: Prostate Specific Ag, Serum: 0.6 ng/mL (ref 0.0–4.0)

## 2021-11-16 ENCOUNTER — Encounter: Payer: Self-pay | Admitting: Psychiatry

## 2021-11-16 ENCOUNTER — Ambulatory Visit (INDEPENDENT_AMBULATORY_CARE_PROVIDER_SITE_OTHER): Payer: BC Managed Care – PPO | Admitting: Psychiatry

## 2021-11-16 DIAGNOSIS — R454 Irritability and anger: Secondary | ICD-10-CM

## 2021-11-16 DIAGNOSIS — F39 Unspecified mood [affective] disorder: Secondary | ICD-10-CM | POA: Diagnosis not present

## 2021-11-16 DIAGNOSIS — F411 Generalized anxiety disorder: Secondary | ICD-10-CM

## 2021-11-16 DIAGNOSIS — F4310 Post-traumatic stress disorder, unspecified: Secondary | ICD-10-CM | POA: Diagnosis not present

## 2021-11-16 DIAGNOSIS — F5105 Insomnia due to other mental disorder: Secondary | ICD-10-CM | POA: Diagnosis not present

## 2021-11-16 MED ORDER — OXCARBAZEPINE 300 MG PO TABS: ORAL_TABLET | ORAL | 1 refills | Status: DC

## 2021-11-16 MED ORDER — SERTRALINE HCL 100 MG PO TABS: ORAL_TABLET | ORAL | 1 refills | Status: DC

## 2021-11-16 MED ORDER — MIRTAZAPINE 15 MG PO TABS: 7.5000 mg | ORAL_TABLET | Freq: Every day | ORAL | 0 refills | Status: DC
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
## 2022-04-12 ENCOUNTER — Encounter: Payer: Self-pay | Admitting: Psychiatry

## 2022-04-12 ENCOUNTER — Ambulatory Visit (INDEPENDENT_AMBULATORY_CARE_PROVIDER_SITE_OTHER): Payer: BC Managed Care – PPO | Admitting: Psychiatry

## 2022-04-12 DIAGNOSIS — F411 Generalized anxiety disorder: Secondary | ICD-10-CM | POA: Diagnosis not present

## 2022-04-12 DIAGNOSIS — F4310 Post-traumatic stress disorder, unspecified: Secondary | ICD-10-CM | POA: Diagnosis not present

## 2022-04-12 DIAGNOSIS — F39 Unspecified mood [affective] disorder: Secondary | ICD-10-CM

## 2022-04-12 DIAGNOSIS — F5105 Insomnia due to other mental disorder: Secondary | ICD-10-CM

## 2022-04-12 DIAGNOSIS — R454 Irritability and anger: Secondary | ICD-10-CM

## 2022-04-12 MED ORDER — OXCARBAZEPINE 300 MG PO TABS
ORAL_TABLET | ORAL | 0 refills | Status: DC
Start: 2022-04-12 — End: 2022-10-05

## 2022-04-12 MED ORDER — SERTRALINE HCL 100 MG PO TABS: ORAL_TABLET | ORAL | 1 refills | Status: DC
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
## 2022-06-28 ENCOUNTER — Other Ambulatory Visit: Payer: Self-pay

## 2022-06-28 ENCOUNTER — Telehealth: Payer: Self-pay | Admitting: Psychiatry

## 2022-06-28 DIAGNOSIS — F5105 Insomnia due to other mental disorder: Secondary | ICD-10-CM

## 2022-06-28 MED ORDER — MIRTAZAPINE 15 MG PO TABS: 7.5000 mg | ORAL_TABLET | Freq: Every day | ORAL | 0 refills | Status: DC

## 2022-06-28 NOTE — Telephone Encounter (Signed)
Rx sent 

## 2022-06-28 NOTE — Telephone Encounter (Signed)
[facility] [address] [place] [address] called requesting new Rx for Mirtazapine 15 mg. New to this pharmacy. Transfer from previous pharmacy. No RF. Pharmacy # [id number]. Apt 11/1

## 2022-06-30 ENCOUNTER — Telehealth: Payer: Self-pay

## 2022-06-30 NOTE — Telephone Encounter (Signed)
Patient's wife called and advised patient needed a refill on medication below.   Medication: tamsulosin (FLOMAX) 0.4 MG CAPS capsule    Pharmacy: [facility]  [address] [place], [place] [address] [phone]    Thank you

## 2022-06-30 NOTE — Telephone Encounter (Signed)
Ok to refill? OV note from 10/19/21 states, " I let him know that he might try taking a vacation from the tamsulosin saying that he is voiding so well."

## 2022-07-06 ENCOUNTER — Ambulatory Visit: Payer: BC Managed Care – PPO | Admitting: Psychiatry

## 2022-07-06 DIAGNOSIS — Z91199 Patient's noncompliance with other medical treatment and regimen due to unspecified reason: Secondary | ICD-10-CM

## 2022-07-06 NOTE — Progress Notes (Signed)
Unable to reach for TC appt

## 2022-07-11 ENCOUNTER — Other Ambulatory Visit: Payer: Self-pay | Admitting: Urology

## 2022-07-11 DIAGNOSIS — N400 Enlarged prostate without lower urinary tract symptoms: Secondary | ICD-10-CM

## 2022-07-11 MED ORDER — TAMSULOSIN HCL 0.4 MG PO CAPS: 0.4000 mg | ORAL_CAPSULE | Freq: Every day | ORAL | 3 refills | Status: AC

## 2022-10-05 ENCOUNTER — Other Ambulatory Visit: Payer: Self-pay

## 2022-10-05 ENCOUNTER — Telehealth: Payer: Self-pay | Admitting: Psychiatry

## 2022-10-05 DIAGNOSIS — F411 Generalized anxiety disorder: Secondary | ICD-10-CM

## 2022-10-05 DIAGNOSIS — F4310 Post-traumatic stress disorder, unspecified: Secondary | ICD-10-CM

## 2022-10-05 DIAGNOSIS — F39 Unspecified mood [affective] disorder: Secondary | ICD-10-CM

## 2022-10-05 DIAGNOSIS — F5105 Insomnia due to other mental disorder: Secondary | ICD-10-CM

## 2022-10-05 MED ORDER — SERTRALINE HCL 100 MG PO TABS: ORAL_TABLET | ORAL | 0 refills | Status: DC

## 2022-10-05 MED ORDER — MIRTAZAPINE 15 MG PO TABS: 7.5000 mg | ORAL_TABLET | Freq: Every day | ORAL | 0 refills | Status: DC

## 2022-10-05 MED ORDER — OXCARBAZEPINE 300 MG PO TABS: ORAL_TABLET | ORAL | 0 refills | Status: DC

## 2022-10-05 NOTE — Telephone Encounter (Signed)
Rx sent 

## 2022-10-05 NOTE — Telephone Encounter (Signed)
Pt needs RFs - Trileptel and Remeron Made appt for March 28. Please send RF to:  [facility], [place], [place]

## 2022-10-05 NOTE — Telephone Encounter (Signed)
Patient called in for refill on Setraline 100mg , . Ph: [facility]

## 2022-10-10 ENCOUNTER — Encounter: Payer: Self-pay | Admitting: Psychiatry

## 2022-10-10 ENCOUNTER — Ambulatory Visit (INDEPENDENT_AMBULATORY_CARE_PROVIDER_SITE_OTHER): Payer: BC Managed Care – PPO | Admitting: Psychiatry

## 2022-10-10 DIAGNOSIS — F411 Generalized anxiety disorder: Secondary | ICD-10-CM

## 2022-10-10 DIAGNOSIS — F4310 Post-traumatic stress disorder, unspecified: Secondary | ICD-10-CM

## 2022-10-10 DIAGNOSIS — F5105 Insomnia due to other mental disorder: Secondary | ICD-10-CM

## 2022-10-10 DIAGNOSIS — F39 Unspecified mood [affective] disorder: Secondary | ICD-10-CM

## 2022-10-10 DIAGNOSIS — R454 Irritability and anger: Secondary | ICD-10-CM

## 2022-10-10 MED ORDER — OXCARBAZEPINE 300 MG PO TABS
ORAL_TABLET | ORAL | 0 refills | Status: DC
Start: 2022-10-10 — End: 2023-01-09

## 2022-10-10 MED ORDER — MIRTAZAPINE 15 MG PO TABS: 7.5000 mg | ORAL_TABLET | Freq: Every day | ORAL | 0 refills | Status: DC

## 2022-10-10 MED ORDER — SERTRALINE HCL 100 MG PO TABS
ORAL_TABLET | ORAL | 0 refills | Status: DC
Start: 2022-10-10 — End: 2023-01-09
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
## 2022-10-18 ENCOUNTER — Ambulatory Visit: Payer: BC Managed Care – PPO | Admitting: Urology

## 2022-11-15 ENCOUNTER — Ambulatory Visit: Payer: Self-pay | Admitting: Urology

## 2022-12-01 ENCOUNTER — Telehealth: Payer: BC Managed Care – PPO | Admitting: Psychiatry

## 2023-01-09 ENCOUNTER — Encounter: Payer: Self-pay | Admitting: Psychiatry

## 2023-01-09 ENCOUNTER — Ambulatory Visit (INDEPENDENT_AMBULATORY_CARE_PROVIDER_SITE_OTHER): Payer: Self-pay | Admitting: Psychiatry

## 2023-01-09 DIAGNOSIS — F4310 Post-traumatic stress disorder, unspecified: Secondary | ICD-10-CM

## 2023-01-09 DIAGNOSIS — R454 Irritability and anger: Secondary | ICD-10-CM

## 2023-01-09 DIAGNOSIS — F5105 Insomnia due to other mental disorder: Secondary | ICD-10-CM

## 2023-01-09 DIAGNOSIS — F411 Generalized anxiety disorder: Secondary | ICD-10-CM

## 2023-01-09 DIAGNOSIS — F39 Unspecified mood [affective] disorder: Secondary | ICD-10-CM

## 2023-01-09 MED ORDER — SERTRALINE HCL 100 MG PO TABS: 200.0000 mg | ORAL_TABLET | Freq: Every day | ORAL | 1 refills | Status: DC

## 2023-01-09 MED ORDER — MIRTAZAPINE 15 MG PO TABS
7.5000 mg | ORAL_TABLET | Freq: Every day | ORAL | 0 refills | Status: DC
Start: 2023-01-09 — End: 2023-07-12

## 2023-01-09 MED ORDER — OXCARBAZEPINE 300 MG PO TABS: ORAL_TABLET | ORAL | 1 refills | Status: DC
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
## 2023-07-12 ENCOUNTER — Encounter: Payer: Self-pay | Admitting: Psychiatry

## 2023-07-12 ENCOUNTER — Ambulatory Visit (INDEPENDENT_AMBULATORY_CARE_PROVIDER_SITE_OTHER): Payer: Self-pay | Admitting: Psychiatry

## 2023-07-12 DIAGNOSIS — F411 Generalized anxiety disorder: Secondary | ICD-10-CM

## 2023-07-12 DIAGNOSIS — F5105 Insomnia due to other mental disorder: Secondary | ICD-10-CM

## 2023-07-12 DIAGNOSIS — F4310 Post-traumatic stress disorder, unspecified: Secondary | ICD-10-CM

## 2023-07-12 DIAGNOSIS — F39 Unspecified mood [affective] disorder: Secondary | ICD-10-CM

## 2023-07-12 MED ORDER — SERTRALINE HCL 100 MG PO TABS
200.0000 mg | ORAL_TABLET | Freq: Every day | ORAL | 3 refills | Status: DC
Start: 2023-07-12 — End: 2024-07-01

## 2023-07-12 MED ORDER — OXCARBAZEPINE 300 MG PO TABS
ORAL_TABLET | ORAL | 3 refills | Status: DC
Start: 2023-07-12 — End: 2024-04-23

## 2023-07-12 MED ORDER — MIRTAZAPINE 15 MG PO TABS
7.5000 mg | ORAL_TABLET | Freq: Every day | ORAL | 3 refills | Status: DC
Start: 2023-07-12 — End: 2024-07-01
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
## 2024-01-08 ENCOUNTER — Encounter: Payer: Self-pay | Admitting: Psychiatry

## 2024-01-08 ENCOUNTER — Ambulatory Visit (INDEPENDENT_AMBULATORY_CARE_PROVIDER_SITE_OTHER): Payer: Self-pay | Admitting: Psychiatry

## 2024-01-08 DIAGNOSIS — F411 Generalized anxiety disorder: Secondary | ICD-10-CM

## 2024-01-08 DIAGNOSIS — F5105 Insomnia due to other mental disorder: Secondary | ICD-10-CM

## 2024-01-08 DIAGNOSIS — R454 Irritability and anger: Secondary | ICD-10-CM

## 2024-01-08 DIAGNOSIS — F4310 Post-traumatic stress disorder, unspecified: Secondary | ICD-10-CM

## 2024-01-08 DIAGNOSIS — F39 Unspecified mood [affective] disorder: Secondary | ICD-10-CM
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
## 2024-03-20 ENCOUNTER — Telehealth: Payer: Self-pay | Admitting: Psychiatry

## 2024-04-23 ENCOUNTER — Encounter: Payer: Self-pay | Admitting: Psychiatry

## 2024-04-23 ENCOUNTER — Ambulatory Visit (INDEPENDENT_AMBULATORY_CARE_PROVIDER_SITE_OTHER): Payer: Self-pay | Admitting: Psychiatry

## 2024-04-23 DIAGNOSIS — F39 Unspecified mood [affective] disorder: Secondary | ICD-10-CM

## 2024-04-23 DIAGNOSIS — F4310 Post-traumatic stress disorder, unspecified: Secondary | ICD-10-CM

## 2024-04-23 DIAGNOSIS — F5105 Insomnia due to other mental disorder: Secondary | ICD-10-CM

## 2024-04-23 DIAGNOSIS — F411 Generalized anxiety disorder: Secondary | ICD-10-CM

## 2024-04-23 DIAGNOSIS — G3184 Mild cognitive impairment, so stated: Secondary | ICD-10-CM

## 2024-04-23 MED ORDER — ARIPIPRAZOLE 10 MG PO TABS
10.0000 mg | ORAL_TABLET | Freq: Every day | ORAL | 0 refills | Status: DC
Start: 2024-04-23 — End: 2024-07-01
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
## 2024-05-03 LAB — B12 AND FOLATE PANEL
Folate: 9.9 ng/mL (ref >3.0)
Vitamin B-12: 659 pg/mL (ref 232–1245)

## 2024-05-09 ENCOUNTER — Ambulatory Visit: Payer: Self-pay | Admitting: Psychiatry

## 2024-05-29 ENCOUNTER — Telehealth: Payer: Self-pay | Admitting: Psychiatry

## 2024-07-01 ENCOUNTER — Ambulatory Visit (INDEPENDENT_AMBULATORY_CARE_PROVIDER_SITE_OTHER): Payer: Self-pay | Admitting: Psychiatry

## 2024-07-01 ENCOUNTER — Encounter: Payer: Self-pay | Admitting: Psychiatry

## 2024-07-01 DIAGNOSIS — F39 Unspecified mood [affective] disorder: Secondary | ICD-10-CM

## 2024-07-01 DIAGNOSIS — F5105 Insomnia due to other mental disorder: Secondary | ICD-10-CM

## 2024-07-01 DIAGNOSIS — F4310 Post-traumatic stress disorder, unspecified: Secondary | ICD-10-CM

## 2024-07-01 DIAGNOSIS — F411 Generalized anxiety disorder: Secondary | ICD-10-CM

## 2024-07-01 MED ORDER — SERTRALINE HCL 100 MG PO TABS
200.0000 mg | ORAL_TABLET | Freq: Every day | ORAL | 1 refills | Status: AC
Start: 2024-07-01 — End: ?

## 2024-07-01 MED ORDER — ARIPIPRAZOLE 10 MG PO TABS: 10.0000 mg | ORAL_TABLET | Freq: Every day | ORAL | 0 refills | Status: DC

## 2024-07-01 MED ORDER — MIRTAZAPINE 15 MG PO TABS
7.5000 mg | ORAL_TABLET | Freq: Every day | ORAL | 3 refills | Status: AC
Start: 2024-07-01 — End: ?
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
## 2024-08-14 ENCOUNTER — Telehealth: Payer: Self-pay | Admitting: Psychiatry

## 2024-08-14 NOTE — Telephone Encounter (Signed)
 Next visit is 09/09/24. Jose [name] has been on Abilify  10 mg for 4 months and continues to feel blah. He feels like its too strong. Please call him at [phone]. Pharmacy is   [facility]- [place], [place] - [place], [place] - [address]   Phone: [phone]  Fax: [phone]

## 2024-08-14 NOTE — Telephone Encounter (Signed)
 It is too high.  Tell him to stop it for a week or so until he feels like the SE went away then cut tablet in 1/2 and that should solve the problem.

## 2024-08-14 NOTE — Telephone Encounter (Signed)
 LVM per DPR with recommendations.

## 2024-09-09 ENCOUNTER — Encounter: Payer: Self-pay | Admitting: Psychiatry

## 2024-09-09 ENCOUNTER — Ambulatory Visit (INDEPENDENT_AMBULATORY_CARE_PROVIDER_SITE_OTHER): Payer: Self-pay | Admitting: Psychiatry

## 2024-09-09 DIAGNOSIS — F411 Generalized anxiety disorder: Secondary | ICD-10-CM

## 2024-09-09 DIAGNOSIS — F5105 Insomnia due to other mental disorder: Secondary | ICD-10-CM

## 2024-09-09 DIAGNOSIS — F4310 Post-traumatic stress disorder, unspecified: Secondary | ICD-10-CM

## 2024-09-09 DIAGNOSIS — F39 Unspecified mood [affective] disorder: Secondary | ICD-10-CM

## 2024-09-09 MED ORDER — ARIPIPRAZOLE 5 MG PO TABS: 5.0000 mg | ORAL_TABLET | Freq: Every day | ORAL | 0 refills | Status: AC
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
## 2024-12-09 ENCOUNTER — Telehealth: Payer: Self-pay | Admitting: Psychiatry
